# Patient Record
Sex: Male | Born: 1955 | Race: White | Hispanic: No | Marital: Married | Smoking: Never smoker
Health system: Southern US, Community
[De-identification: ages and names within clinical notes are randomized; demographics above are authoritative.]

## PROBLEM LIST (undated history)

## (undated) DIAGNOSIS — M1990 Unspecified osteoarthritis, unspecified site: Secondary | ICD-10-CM

## (undated) DIAGNOSIS — IMO0001 Reserved for inherently not codable concepts without codable children: Secondary | ICD-10-CM

## (undated) DIAGNOSIS — R030 Elevated blood-pressure reading, without diagnosis of hypertension: Secondary | ICD-10-CM

## (undated) DIAGNOSIS — N200 Calculus of kidney: Secondary | ICD-10-CM

## (undated) DIAGNOSIS — M5126 Other intervertebral disc displacement, lumbar region: Secondary | ICD-10-CM

## (undated) DIAGNOSIS — S68411A Complete traumatic amputation of right hand at wrist level, initial encounter: Secondary | ICD-10-CM

## (undated) HISTORY — PX: REATTACHMENT HAND: SHX5175

## (undated) HISTORY — DX: Calculus of kidney: N20.0

## (undated) HISTORY — PX: INGUINAL HERNIA REPAIR: SHX194

## (undated) HISTORY — DX: Elevated blood-pressure reading, without diagnosis of hypertension: R03.0

## (undated) HISTORY — PX: BACK SURGERY: SHX140

## (undated) HISTORY — PX: OTHER SURGICAL HISTORY: SHX169

## (undated) HISTORY — PX: LUMBAR LAMINECTOMY: SHX95

## (undated) HISTORY — DX: Complete traumatic amputation of right hand at wrist level, initial encounter: S68.411A

## (undated) HISTORY — DX: Other intervertebral disc displacement, lumbar region: M51.26

## (undated) HISTORY — PX: CERVICAL FUSION: SHX112

## (undated) HISTORY — PX: COLONOSCOPY: SHX174

## (undated) HISTORY — DX: Reserved for inherently not codable concepts without codable children: IMO0001

## (undated) HISTORY — DX: Unspecified osteoarthritis, unspecified site: M19.90

---

## 2015-02-22 ENCOUNTER — Encounter: Payer: Self-pay | Admitting: Physician Assistant

## 2015-02-22 ENCOUNTER — Ambulatory Visit (INDEPENDENT_AMBULATORY_CARE_PROVIDER_SITE_OTHER): Payer: Commercial Managed Care - HMO | Admitting: Physician Assistant

## 2015-02-22 VITALS — BP 128/94 | HR 90 | Temp 98.0°F | Resp 16 | Ht 71.0 in | Wt 226.4 lb

## 2015-02-22 DIAGNOSIS — M130 Polyarthritis, unspecified: Secondary | ICD-10-CM | POA: Insufficient documentation

## 2015-02-22 DIAGNOSIS — M1990 Unspecified osteoarthritis, unspecified site: Secondary | ICD-10-CM | POA: Diagnosis not present

## 2015-02-22 DIAGNOSIS — M5090 Cervical disc disorder, unspecified, unspecified cervical region: Secondary | ICD-10-CM | POA: Insufficient documentation

## 2015-02-22 DIAGNOSIS — M5416 Radiculopathy, lumbar region: Secondary | ICD-10-CM | POA: Insufficient documentation

## 2015-02-22 MED ORDER — GABAPENTIN 100 MG PO CAPS: 100.0000 mg | ORAL_CAPSULE | Freq: Three times a day (TID) | ORAL | Status: DC

## 2015-02-22 NOTE — Assessment & Plan Note (Signed)
Referral to Neurosurgery placed. Will obtain old imaging records.

## 2015-02-22 NOTE — Patient Instructions (Signed)
Please take the Gabapentin as follows:   -- Take 1 tablet at bedtime x 2 days.   -- Then take 1 tablet each morning and at bedtime x 2 days.   -- Then increase to 1 tablet 3 x day.  You will be contacted by Neurosurgery for evaluation. Continue Aleve as needed. The swimming is good for your joints.  Follow-up 1 month. We will do your physical at that time.  Welcome to [facility]!

## 2015-02-22 NOTE — Progress Notes (Signed)
Patient presents to clinic today to establish care.  Patient with significant history of chronic cervical and lumbar spinal issues. Patient with chronic lumbar radiculopathy status post laminectomy in 2008. Endorses significant pain daily. Is only taking Aleve as needed with little relief of symptoms patient also with history of cervical disc disease and radiculopathy. Is not currently followed by neurosurgery, as his previous specialist passed away. Would like referral to new specialist. Would like to discuss non-narcotic options for pain management.  Past Medical History  Diagnosis Date  . 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 Herniation of lumbar intervertebral disc     and Cervical Neck  . Arthritis     Back  . Kidney stone     Last 25 years ago  . Elevated blood pressure     Past Surgical History  Procedure Laterality Date  . Cervical fusion      2009  . Reattachment hand      1981  . Inguinal hernia repair      x4  . Lumbar laminectomy      2011  . Medial facetectomy      No current outpatient prescriptions on file prior to visit.   No current facility-administered medications on file prior to visit.    No Known Allergies  Family History  Problem Relation Age of Onset  . Arthritis Mother     Living  . Prostate cancer Father 48    Deceased  . Stroke Father   . Bladder Cancer Brother   . Alzheimer's disease Maternal Grandfather   . Heart disease Other     Maternal Aunts & Uncles  . Cancer Other     Paternal Aunts & Uncles  . Heart disease Sister     x1  . Healthy Brother     x3  . Psychiatric Illness Brother     x1    Social History   Social History  . Marital Status: Married    Spouse Name: N/A  . Number of Children: 0  . Years of Education: N/A   Occupational History  . Not on file.   Social History Main Topics  . Smoking status: Never Smoker   . Smokeless tobacco: Never Used  . Alcohol Use: No  . Drug Use: No  .  Sexual Activity: Not on file   Other Topics Concern  . Not on file   Social History Narrative  . No narrative on file   Review of Systems  Constitutional: Negative for fever and weight loss.  Musculoskeletal: Positive for back pain, joint pain and neck pain. Negative for myalgias and falls.  Neurological: Negative for dizziness and loss of consciousness.  Psychiatric/Behavioral: Negative for depression. The patient is not nervous/anxious and does not have insomnia.    BP 128/94 mmHg  Pulse 90  Temp(Src) 98 F (36.7 C) (Oral)  Resp 16  Ht 5\' 11"  (1.803 m)  Wt 226 lb 6 oz (102.683 kg)  BMI 31.59 kg/m2  SpO2 98%  Physical Exam  Constitutional: He is oriented to person, place, and time and well-developed, well-nourished, and in no distress.  HENT:  Head: Normocephalic and atraumatic.  Cardiovascular: Normal rate, regular rhythm, normal heart sounds and intact distal pulses.   Pulmonary/Chest: Effort normal and breath sounds normal. No respiratory distress. He has no wheezes. He has no rales. He exhibits no tenderness.  Musculoskeletal:       Cervical back: He exhibits pain.  He exhibits no tenderness and no bony tenderness.       Thoracic back: He exhibits pain. He exhibits no tenderness and no bony tenderness.       Lumbar back: He exhibits pain. He exhibits no tenderness and no bony tenderness.  Neurological: He is alert and oriented to person, place, and time.  Skin: Skin is warm and dry. No rash noted.  Psychiatric: Affect normal.  Vitals reviewed.    Assessment/Plan: Chronic lumbar radiculopathy S/p laminectomy in 2008. With chronic pain and still with occasional radicular symptoms. Will refer to Neurosurgery. Discussed pain management options but patient does not with to take any controlled substances. Will begin Gabapentin 100 mg QD x 2 days, BID x 2 days then TID. Will likely titrate to 300 mg TID at next visit to reach therapeutic response. Continue low-impact exercise,  especially swimming.  Cervical disc disease Referral to Neurosurgery placed. Will obtain old imaging records.

## 2015-02-22 NOTE — Progress Notes (Signed)
Pre visit review using our clinic review tool, if applicable. No additional management support is needed unless otherwise documented below in the visit note/SLS  

## 2015-02-22 NOTE — Assessment & Plan Note (Signed)
S/p laminectomy in 2008. With chronic pain and still with occasional radicular symptoms. Will refer to Neurosurgery. Discussed pain management options but patient does not with to take any controlled substances. Will begin Gabapentin 100 mg QD x 2 days, BID x 2 days then TID. Will likely titrate to 300 mg TID at next visit to reach therapeutic response. Continue low-impact exercise, especially swimming.

## 2015-03-06 ENCOUNTER — Telehealth: Payer: Self-pay | Admitting: Physician Assistant

## 2015-03-06 MED ORDER — GABAPENTIN 100 MG PO CAPS: 100.0000 mg | ORAL_CAPSULE | Freq: Three times a day (TID) | ORAL | Status: DC

## 2015-03-06 NOTE — Telephone Encounter (Signed)
Patient is tolerating medication, informed of change in dosage, understood & agreed & is set up to see Neurosurgery; change made is SIG for Gabapentin & pt will let us know when new Rx order is needed/SLS

## 2015-03-06 NOTE — Telephone Encounter (Signed)
Pt returning your call. Best # [phone].

## 2015-03-06 NOTE — Telephone Encounter (Signed)
LMOM with contact name and number for return call [id number] per provider instructions/SLS

## 2015-03-06 NOTE — Telephone Encounter (Signed)
-----   Message from Waldon Merl, PA-C sent at 02/22/2015 12:47 PM EDT ----- Regarding: Gabapentin Call patient to see how he is tolerating Gabapentin -- If so increase bedtime dose to 300 mg.

## 2015-03-13 ENCOUNTER — Other Ambulatory Visit: Payer: Self-pay | Admitting: Neurological Surgery

## 2015-03-22 ENCOUNTER — Ambulatory Visit: Payer: Commercial Managed Care - HMO | Admitting: Physician Assistant

## 2015-03-22 ENCOUNTER — Encounter: Payer: Commercial Managed Care - HMO | Admitting: Physician Assistant

## 2015-03-23 ENCOUNTER — Other Ambulatory Visit (HOSPITAL_COMMUNITY): Payer: Self-pay | Admitting: *Deleted

## 2015-03-23 NOTE — Pre-Procedure Instructions (Signed)
Gregory Foley  03/23/2015     Your procedure is scheduled on Monday, April 03, 2015 at 11:40 AM.   Report to [facility] Entrance "A" Admitting Office at 8:45 AM.   Call this number if you have problems the morning of surgery: [phone]   Any questions prior to day of surgery, please call [phone] between 8 & 4 PM.   Remember:  Do not eat food or drink liquids after midnight Sunday, 04/02/15.  Take these medicines the morning of surgery with A SIP OF WATER: Gabapentin (Neurontin)  Stop NSAIDS (Aleve, Ibuprofen, etc.) 7 days prior to surgery. Do not use any Aspirin products 7 days prior to surgery.   Do not wear jewelry.  Do not wear lotions, powders, or cologne.  You may wear deodorant.  Men may shave face and neck.  Do not bring valuables to the hospital.  [facility] is not responsible for any belongings or valuables.  Contacts, dentures or bridgework may not be worn into surgery.  Leave your suitcase in the car.  After surgery it may be brought to your room.  For patients admitted to the hospital, discharge time will be determined by your treatment team.  Special instructions: [place] - Preparing for Surgery  Before surgery, you can play an important role.  Because skin is not sterile, your skin needs to be as free of germs as possible.  You can reduce the number of germs on you skin by washing with CHG (chlorahexidine gluconate) soap before surgery.  CHG is an antiseptic cleaner which kills germs and bonds with the skin to continue killing germs even after washing.  Please DO NOT use if you have an allergy to CHG or antibacterial soaps.  If your skin becomes reddened/irritated stop using the CHG and inform your nurse when you arrive at Short Stay.  Do not shave (including legs and underarms) for at least 48 hours prior to the first CHG shower.  You may shave your face.  Please follow these instructions carefully:   1.  Shower with CHG Soap the night  before surgery and the                                morning of Surgery.  2.  If you choose to wash your hair, wash your hair first as usual with your       normal shampoo.  3.  After you shampoo, rinse your hair and body thoroughly to remove the                      Shampoo.  4.  Use CHG as you would any other liquid soap.  You can apply chg directly       to the skin and wash gently with scrungie or a clean washcloth.  5.  Apply the CHG Soap to your body ONLY FROM THE NECK DOWN.        Do not use on open wounds or open sores.  Avoid contact with your eyes, ears, mouth and genitals (private parts).  Wash genitals (private parts) with your normal soap.  6.  Wash thoroughly, paying special attention to the area where your surgery        will be performed.  7.  Thoroughly rinse your body with warm water from the neck down.  8.  DO NOT shower/wash with your normal soap after using  and rinsing off       the CHG Soap.  9.  Pat yourself dry with a clean towel.            10.  Wear clean pajamas.            11.  Place clean sheets on your bed the night of your first shower and do not        sleep with pets.  Day of Surgery  Do not apply any lotions the morning of surgery.  Please wear clean clothes to the hospital.   Please read over the following fact sheets that you were given. Pain Booklet, Coughing and Deep Breathing, Blood Transfusion Information, MRSA Information and Surgical Site Infection Prevention

## 2015-03-24 ENCOUNTER — Encounter (HOSPITAL_COMMUNITY): Payer: Self-pay

## 2015-03-24 ENCOUNTER — Encounter (HOSPITAL_COMMUNITY)
Admission: RE | Admit: 2015-03-24 | Discharge: 2015-03-24 | Disposition: A | Discharge status: Home / Self Care | Payer: Commercial Managed Care - HMO | Source: Ambulatory Visit | Attending: Neurological Surgery | Admitting: Neurological Surgery

## 2015-03-24 DIAGNOSIS — Z01818 Encounter for other preprocedural examination: Secondary | ICD-10-CM | POA: Insufficient documentation

## 2015-03-24 LAB — CBC
HEMATOCRIT: 47 % (ref 39.0–52.0)
Hemoglobin: 16 g/dL (ref 13.0–17.0)
MCH: 29.4 pg (ref 26.0–34.0)
MCHC: 34 g/dL (ref 30.0–36.0)
MCV: 86.4 fL (ref 78.0–100.0)
PLATELETS: 142 10*3/uL — AB (ref 150–400)
RBC: 5.44 MIL/uL (ref 4.22–5.81)
RDW: 13.2 % (ref 11.5–15.5)
WBC: 4.9 10*3/uL (ref 4.0–10.5)

## 2015-03-24 LAB — SURGICAL PCR SCREEN
MRSA, PCR: NEGATIVE
STAPHYLOCOCCUS AUREUS: POSITIVE — AB

## 2015-03-24 LAB — TYPE AND SCREEN
ABO/RH(D): A NEG
ANTIBODY SCREEN: NEGATIVE

## 2015-03-24 LAB — ABO/RH: ABO/RH(D): A NEG

## 2015-03-24 NOTE — Progress Notes (Addendum)
Anesthesia Chart Review:  Pt is 59 year old male scheduled for L3-4, L4-5, L5-S1 PLIF on 04/03/2015 with Dr. Danielle Dess.   PMH includes: elevated blood pressure without diagnosis of HTN. Never smoker. BMI 32.   Preoperative labs reviewed.    EKG 03/24/2015: NSR. Low voltage QRS. Septal infarct, age undetermined.   Vital signs were not recorded at PAT. If they are acceptable DOS, I anticipate pt can proceed as scheduled.   Rica Mast, FNP-BC [facility]/Anesthesiology Phone: [phone] 03/27/2015 4:47 PM

## 2015-03-24 NOTE — Progress Notes (Signed)
Mupirocin Ointment Rx called into [facility] on [address] in [facility] for positive PCR of Staph. Left message on pt's voicemail informing him of results.

## 2015-03-24 NOTE — Progress Notes (Signed)
Pt denies cardiac history, chest pain or sob. He states that he was sent to an urgent care from his dentist to have his BP checked several years ago. He states the MD at the urgent care put him on a BP medication and he took it for a short time but his PCP didn't feel that he needed to continue it. He states his BP can be high at times, especially if he is in pain or at a physician's office.

## 2015-04-03 ENCOUNTER — Encounter (HOSPITAL_COMMUNITY)
Admission: AD | Disposition: A | Discharge status: Home / Self Care | Payer: Medicare Other | Source: Ambulatory Visit | Attending: Neurological Surgery

## 2015-04-03 ENCOUNTER — Encounter (HOSPITAL_COMMUNITY): Payer: Self-pay | Admitting: Certified Registered Nurse Anesthetist

## 2015-04-03 ENCOUNTER — Inpatient Hospital Stay (HOSPITAL_COMMUNITY): Payer: Commercial Managed Care - HMO | Admitting: Emergency Medicine

## 2015-04-03 ENCOUNTER — Inpatient Hospital Stay (HOSPITAL_COMMUNITY): Payer: Commercial Managed Care - HMO

## 2015-04-03 ENCOUNTER — Inpatient Hospital Stay (HOSPITAL_COMMUNITY)
Admission: AD | Admit: 2015-04-03 | Discharge: 2015-04-06 | DRG: 460 | Disposition: A | Discharge status: Home / Self Care | Payer: Commercial Managed Care - HMO | Source: Ambulatory Visit | Attending: Neurological Surgery | Admitting: Neurological Surgery

## 2015-04-03 ENCOUNTER — Inpatient Hospital Stay (HOSPITAL_COMMUNITY): Payer: Commercial Managed Care - HMO | Admitting: Certified Registered Nurse Anesthetist

## 2015-04-03 DIAGNOSIS — M4726 Other spondylosis with radiculopathy, lumbar region: Secondary | ICD-10-CM | POA: Diagnosis present

## 2015-04-03 DIAGNOSIS — D62 Acute posthemorrhagic anemia: Secondary | ICD-10-CM | POA: Diagnosis not present

## 2015-04-03 DIAGNOSIS — R319 Hematuria, unspecified: Secondary | ICD-10-CM | POA: Diagnosis not present

## 2015-04-03 DIAGNOSIS — N179 Acute kidney failure, unspecified: Secondary | ICD-10-CM | POA: Diagnosis not present

## 2015-04-03 DIAGNOSIS — Z419 Encounter for procedure for purposes other than remedying health state, unspecified: Secondary | ICD-10-CM

## 2015-04-03 DIAGNOSIS — M960 Pseudarthrosis after fusion or arthrodesis: Secondary | ICD-10-CM | POA: Diagnosis present

## 2015-04-03 DIAGNOSIS — D696 Thrombocytopenia, unspecified: Secondary | ICD-10-CM | POA: Diagnosis not present

## 2015-04-03 DIAGNOSIS — M48062 Spinal stenosis, lumbar region with neurogenic claudication: Secondary | ICD-10-CM | POA: Diagnosis present

## 2015-04-03 DIAGNOSIS — M4806 Spinal stenosis, lumbar region: Secondary | ICD-10-CM | POA: Diagnosis not present

## 2015-04-03 DIAGNOSIS — M545 Low back pain: Secondary | ICD-10-CM | POA: Diagnosis present

## 2015-04-03 DIAGNOSIS — M7130 Other bursal cyst, unspecified site: Secondary | ICD-10-CM | POA: Diagnosis present

## 2015-04-03 DIAGNOSIS — Z79899 Other long term (current) drug therapy: Secondary | ICD-10-CM | POA: Diagnosis not present

## 2015-04-03 LAB — POCT I-STAT 4, (NA,K, GLUC, HGB,HCT)
GLUCOSE: 113 mg/dL — AB (ref 65–99)
Glucose, Bld: 138 mg/dL — ABNORMAL HIGH (ref 65–99)
HCT: 34 % — ABNORMAL LOW (ref 39.0–52.0)
HEMATOCRIT: 42 % (ref 39.0–52.0)
HEMOGLOBIN: 14.3 g/dL (ref 13.0–17.0)
Hemoglobin: 11.6 g/dL — ABNORMAL LOW (ref 13.0–17.0)
Potassium: 4.4 mmol/L (ref 3.5–5.1)
Potassium: 4.3 mmol/L (ref 3.5–5.1)
SODIUM: 141 mmol/L (ref 135–145)
Sodium: 139 mmol/L (ref 135–145)

## 2015-04-03 SURGERY — POSTERIOR LUMBAR FUSION 3 LEVEL
Anesthesia: General | Site: Back

## 2015-04-03 MED ORDER — SODIUM CHLORIDE 0.9 % IR SOLN
Status: DC | PRN
  Administered 2015-04-03: 11:00:00

## 2015-04-03 MED ORDER — HYDROMORPHONE HCL 1 MG/ML IJ SOLN
0.2500 mg | INTRAMUSCULAR | Status: DC | PRN
  Administered 2015-04-03: 0.5 mg via INTRAVENOUS

## 2015-04-03 MED ORDER — SODIUM CHLORIDE 0.9 % IV SOLN
INTRAVENOUS | Status: DC | PRN
  Administered 2015-04-03 (×2): via INTRAVENOUS

## 2015-04-03 MED ORDER — ROCURONIUM BROMIDE 100 MG/10ML IV SOLN
INTRAVENOUS | Status: DC | PRN
  Administered 2015-04-03: 50 mg via INTRAVENOUS

## 2015-04-03 MED ORDER — SODIUM CHLORIDE 0.9 % IJ SOLN
INTRAMUSCULAR | Status: AC
  Filled 2015-04-03: qty 10

## 2015-04-03 MED ORDER — CEFAZOLIN SODIUM 1-5 GM-% IV SOLN
1.0000 g | Freq: Three times a day (TID) | INTRAVENOUS | Status: AC
  Administered 2015-04-03 – 2015-04-04 (×2): 1 g via INTRAVENOUS
  Filled 2015-04-03 (×2): qty 50

## 2015-04-03 MED ORDER — CEFAZOLIN SODIUM-DEXTROSE 2-3 GM-% IV SOLR
INTRAVENOUS | Status: AC
  Filled 2015-04-03: qty 50

## 2015-04-03 MED ORDER — SENNA 8.6 MG PO TABS
1.0000 | ORAL_TABLET | Freq: Two times a day (BID) | ORAL | Status: DC
  Administered 2015-04-03 – 2015-04-06 (×6): 8.6 mg via ORAL
  Filled 2015-04-03 (×6): qty 1

## 2015-04-03 MED ORDER — CEFAZOLIN SODIUM-DEXTROSE 2-3 GM-% IV SOLR
INTRAVENOUS | Status: DC | PRN
  Administered 2015-04-03 (×2): 2 g via INTRAVENOUS

## 2015-04-03 MED ORDER — FENTANYL CITRATE (PF) 250 MCG/5ML IJ SOLN
INTRAMUSCULAR | Status: AC
  Filled 2015-04-03: qty 5

## 2015-04-03 MED ORDER — ACETAMINOPHEN 325 MG PO TABS: 650.0000 mg | ORAL_TABLET | ORAL | Status: DC | PRN

## 2015-04-03 MED ORDER — MEPERIDINE HCL 25 MG/ML IJ SOLN: 6.2500 mg | INTRAMUSCULAR | Status: DC | PRN

## 2015-04-03 MED ORDER — GABAPENTIN 100 MG PO CAPS: 100.0000 mg | ORAL_CAPSULE | Freq: Three times a day (TID) | ORAL | Status: DC

## 2015-04-03 MED ORDER — SODIUM CHLORIDE 0.9 % IJ SOLN
3.0000 mL | Freq: Two times a day (BID) | INTRAMUSCULAR | Status: DC
  Administered 2015-04-03 – 2015-04-05 (×4): 3 mL via INTRAVENOUS

## 2015-04-03 MED ORDER — GABAPENTIN 100 MG PO CAPS
100.0000 mg | ORAL_CAPSULE | ORAL | Status: DC
Start: 2015-04-04 — End: 2015-04-05
  Administered 2015-04-04 – 2015-04-05 (×3): 100 mg via ORAL
  Filled 2015-04-03 (×3): qty 1

## 2015-04-03 MED ORDER — FENTANYL CITRATE (PF) 100 MCG/2ML IJ SOLN
INTRAMUSCULAR | Status: DC | PRN
  Administered 2015-04-03 (×2): 50 ug via INTRAVENOUS
  Administered 2015-04-03: 100 ug via INTRAVENOUS
  Administered 2015-04-03: 50 ug via INTRAVENOUS

## 2015-04-03 MED ORDER — LIDOCAINE HCL (CARDIAC) 20 MG/ML IV SOLN
INTRAVENOUS | Status: DC | PRN
  Administered 2015-04-03: 80 mg via INTRAVENOUS

## 2015-04-03 MED ORDER — ONDANSETRON HCL 4 MG/2ML IJ SOLN
4.0000 mg | INTRAMUSCULAR | Status: DC | PRN
  Administered 2015-04-03 – 2015-04-04 (×3): 4 mg via INTRAVENOUS
  Filled 2015-04-03 (×3): qty 2

## 2015-04-03 MED ORDER — DEXAMETHASONE SODIUM PHOSPHATE 10 MG/ML IJ SOLN
INTRAMUSCULAR | Status: DC | PRN
  Administered 2015-04-03: 10 mg via INTRAVENOUS

## 2015-04-03 MED ORDER — OXYCODONE-ACETAMINOPHEN 5-325 MG PO TABS
1.0000 | ORAL_TABLET | ORAL | Status: DC | PRN
  Administered 2015-04-05 – 2015-04-06 (×5): 2 via ORAL
  Filled 2015-04-03 (×5): qty 2

## 2015-04-03 MED ORDER — EPHEDRINE SULFATE 50 MG/ML IJ SOLN
INTRAMUSCULAR | Status: AC
  Filled 2015-04-03: qty 1

## 2015-04-03 MED ORDER — MENTHOL 3 MG MT LOZG: 1.0000 | LOZENGE | OROMUCOSAL | Status: DC | PRN

## 2015-04-03 MED ORDER — DOCUSATE SODIUM 100 MG PO CAPS
100.0000 mg | ORAL_CAPSULE | Freq: Two times a day (BID) | ORAL | Status: DC
  Administered 2015-04-03 – 2015-04-06 (×6): 100 mg via ORAL
  Filled 2015-04-03 (×6): qty 1

## 2015-04-03 MED ORDER — ARTIFICIAL TEARS OP OINT
TOPICAL_OINTMENT | OPHTHALMIC | Status: AC
  Filled 2015-04-03: qty 3.5

## 2015-04-03 MED ORDER — LIDOCAINE HCL (CARDIAC) 20 MG/ML IV SOLN
INTRAVENOUS | Status: AC
  Filled 2015-04-03: qty 5

## 2015-04-03 MED ORDER — LIDOCAINE-EPINEPHRINE 1 %-1:100000 IJ SOLN
INTRAMUSCULAR | Status: DC | PRN
  Administered 2015-04-03: 10 mL

## 2015-04-03 MED ORDER — VECURONIUM BROMIDE 10 MG IV SOLR
INTRAVENOUS | Status: DC | PRN
  Administered 2015-04-03: 2 mg via INTRAVENOUS
  Administered 2015-04-03: 3 mg via INTRAVENOUS
  Administered 2015-04-03 (×4): 2 mg via INTRAVENOUS
  Administered 2015-04-03: 3 mg via INTRAVENOUS
  Administered 2015-04-03 (×2): 2 mg via INTRAVENOUS

## 2015-04-03 MED ORDER — MIDAZOLAM HCL 5 MG/5ML IJ SOLN
INTRAMUSCULAR | Status: DC | PRN
  Administered 2015-04-03: 2 mg via INTRAVENOUS

## 2015-04-03 MED ORDER — BUPIVACAINE HCL (PF) 0.5 % IJ SOLN
INTRAMUSCULAR | Status: DC | PRN
  Administered 2015-04-03: 10 mL
  Administered 2015-04-03: 25 mL

## 2015-04-03 MED ORDER — KETOROLAC TROMETHAMINE 15 MG/ML IJ SOLN
15.0000 mg | Freq: Four times a day (QID) | INTRAMUSCULAR | Status: DC
  Administered 2015-04-04 (×2): 15 mg via INTRAVENOUS
  Filled 2015-04-03 (×2): qty 1

## 2015-04-03 MED ORDER — POLYETHYLENE GLYCOL 3350 17 G PO PACK: 17.0000 g | PACK | Freq: Every day | ORAL | Status: DC | PRN

## 2015-04-03 MED ORDER — SODIUM CHLORIDE 0.9 % IJ SOLN
INTRAMUSCULAR | Status: AC
  Filled 2015-04-03: qty 20

## 2015-04-03 MED ORDER — GABAPENTIN 300 MG PO CAPS
300.0000 mg | ORAL_CAPSULE | Freq: Every day | ORAL | Status: DC
  Administered 2015-04-03 – 2015-04-04 (×2): 300 mg via ORAL
  Filled 2015-04-03 (×2): qty 1

## 2015-04-03 MED ORDER — ACETAMINOPHEN 650 MG RE SUPP: 650.0000 mg | RECTAL | Status: DC | PRN

## 2015-04-03 MED ORDER — METHOCARBAMOL 1000 MG/10ML IJ SOLN: 500.0000 mg | INTRAVENOUS | Status: DC

## 2015-04-03 MED ORDER — SUGAMMADEX SODIUM 500 MG/5ML IV SOLN
INTRAVENOUS | Status: AC
  Filled 2015-04-03: qty 5

## 2015-04-03 MED ORDER — ROCURONIUM BROMIDE 50 MG/5ML IV SOLN
INTRAVENOUS | Status: AC
  Filled 2015-04-03: qty 2

## 2015-04-03 MED ORDER — ONDANSETRON HCL 4 MG/2ML IJ SOLN
INTRAMUSCULAR | Status: DC | PRN
  Administered 2015-04-03: 4 mg via INTRAVENOUS

## 2015-04-03 MED ORDER — PROPOFOL 10 MG/ML IV BOLUS
INTRAVENOUS | Status: DC | PRN
  Administered 2015-04-03: 200 mg via INTRAVENOUS

## 2015-04-03 MED ORDER — PROPOFOL 10 MG/ML IV BOLUS
INTRAVENOUS | Status: AC
  Filled 2015-04-03: qty 20

## 2015-04-03 MED ORDER — SODIUM CHLORIDE 0.9 % IV SOLN
INTRAVENOUS | Status: DC
  Administered 2015-04-03: 100 mL/h via INTRAVENOUS

## 2015-04-03 MED ORDER — METHOCARBAMOL 1000 MG/10ML IJ SOLN
500.0000 mg | Freq: Four times a day (QID) | INTRAVENOUS | Status: DC | PRN
  Administered 2015-04-03: 500 mg via INTRAVENOUS
  Filled 2015-04-03 (×2): qty 5

## 2015-04-03 MED ORDER — LACTATED RINGERS IV SOLN
INTRAVENOUS | Status: DC | PRN
  Administered 2015-04-03 (×4): via INTRAVENOUS

## 2015-04-03 MED ORDER — BISACODYL 10 MG RE SUPP: 10.0000 mg | Freq: Every day | RECTAL | Status: DC | PRN

## 2015-04-03 MED ORDER — MIDAZOLAM HCL 2 MG/2ML IJ SOLN
INTRAMUSCULAR | Status: AC
  Filled 2015-04-03: qty 4

## 2015-04-03 MED ORDER — ALBUMIN HUMAN 5 % IV SOLN
INTRAVENOUS | Status: DC | PRN
  Administered 2015-04-03 (×2): via INTRAVENOUS

## 2015-04-03 MED ORDER — HYDROCODONE-ACETAMINOPHEN 5-325 MG PO TABS
1.0000 | ORAL_TABLET | ORAL | Status: DC | PRN
  Administered 2015-04-04: 2 via ORAL
  Filled 2015-04-03: qty 2

## 2015-04-03 MED ORDER — MAGNESIUM CITRATE PO SOLN: 1.0000 | Freq: Once | ORAL | Status: DC | PRN

## 2015-04-03 MED ORDER — HYDROMORPHONE HCL 1 MG/ML IJ SOLN
INTRAMUSCULAR | Status: AC
  Filled 2015-04-03: qty 1

## 2015-04-03 MED ORDER — SODIUM CHLORIDE 0.9 % IJ SOLN: 3.0000 mL | INTRAMUSCULAR | Status: DC | PRN

## 2015-04-03 MED ORDER — HYDROMORPHONE HCL 1 MG/ML IJ SOLN
0.5000 mg | INTRAMUSCULAR | Status: DC | PRN
  Administered 2015-04-04: 1 mg via INTRAVENOUS
  Filled 2015-04-03 (×2): qty 1

## 2015-04-03 MED ORDER — ONDANSETRON HCL 4 MG/2ML IJ SOLN
INTRAMUSCULAR | Status: AC
  Filled 2015-04-03: qty 2

## 2015-04-03 MED ORDER — PHENOL 1.4 % MT LIQD
1.0000 | OROMUCOSAL | Status: DC | PRN
Start: 2015-04-03 — End: 2015-04-06

## 2015-04-03 MED ORDER — 0.9 % SODIUM CHLORIDE (POUR BTL) OPTIME
TOPICAL | Status: DC | PRN
  Administered 2015-04-03: 1000 mL

## 2015-04-03 MED ORDER — CEFAZOLIN SODIUM-DEXTROSE 2-3 GM-% IV SOLR: 2.0000 g | INTRAVENOUS | Status: DC

## 2015-04-03 MED ORDER — METHOCARBAMOL 500 MG PO TABS
500.0000 mg | ORAL_TABLET | Freq: Four times a day (QID) | ORAL | Status: DC | PRN
Start: 2015-04-03 — End: 2015-04-06
  Administered 2015-04-05: 500 mg via ORAL
  Filled 2015-04-03 (×2): qty 1

## 2015-04-03 MED ORDER — VECURONIUM BROMIDE 10 MG IV SOLR
INTRAVENOUS | Status: AC
  Filled 2015-04-03: qty 10

## 2015-04-03 MED ORDER — ALUM & MAG HYDROXIDE-SIMETH 200-200-20 MG/5ML PO SUSP: 30.0000 mL | Freq: Four times a day (QID) | ORAL | Status: DC | PRN

## 2015-04-03 MED ORDER — ROCURONIUM BROMIDE 50 MG/5ML IV SOLN
INTRAVENOUS | Status: AC
  Filled 2015-04-03: qty 1

## 2015-04-03 MED ORDER — LACTATED RINGERS IV SOLN: INTRAVENOUS | Status: DC

## 2015-04-03 MED ORDER — PROMETHAZINE HCL 25 MG/ML IJ SOLN: 6.2500 mg | INTRAMUSCULAR | Status: DC | PRN

## 2015-04-03 MED ORDER — THROMBIN 20000 UNITS EX SOLR
CUTANEOUS | Status: DC | PRN
  Administered 2015-04-03 (×2): via TOPICAL

## 2015-04-03 MED ORDER — THROMBIN 5000 UNITS EX SOLR
OROMUCOSAL | Status: DC | PRN
  Administered 2015-04-03: 11:00:00 via TOPICAL

## 2015-04-03 MED ORDER — PHENYLEPHRINE HCL 10 MG/ML IJ SOLN
10.0000 mg | INTRAVENOUS | Status: DC | PRN
  Administered 2015-04-03: 10 ug/min via INTRAVENOUS

## 2015-04-03 MED ORDER — SODIUM CHLORIDE 0.9 % IV SOLN: 250.0000 mL | INTRAVENOUS | Status: DC

## 2015-04-03 MED ORDER — SUGAMMADEX SODIUM 500 MG/5ML IV SOLN
INTRAVENOUS | Status: DC | PRN
  Administered 2015-04-03: 200 mg via INTRAVENOUS

## 2015-04-03 MED ORDER — LACTATED RINGERS IV SOLN
INTRAVENOUS | Status: DC
  Administered 2015-04-03: 10:00:00 via INTRAVENOUS

## 2015-04-03 MED ORDER — EPHEDRINE SULFATE 50 MG/ML IJ SOLN
INTRAMUSCULAR | Status: DC | PRN
  Administered 2015-04-03 (×2): 5 mg via INTRAVENOUS

## 2015-04-03 SURGICAL SUPPLY — 72 items
BAG DECANTER FOR FLEXI CONT (MISCELLANEOUS) ×3 IMPLANT
BLADE CLIPPER SURG (BLADE) IMPLANT
BUR MATCHSTICK NEURO 3.0 LAGG (BURR) ×3 IMPLANT
CAGE COROENT LRG MP 8X9X28-12 (Cage) ×12 IMPLANT
CAGE PLIF MAS 9X8X28-4 LUMBAR (Cage) ×6 IMPLANT
CANISTER SUCT 3000ML PPV (MISCELLANEOUS) ×3 IMPLANT
CONT SPEC 4OZ CLIKSEAL STRL BL (MISCELLANEOUS) ×3 IMPLANT
COVER BACK TABLE 60X90IN (DRAPES) ×3 IMPLANT
DECANTER SPIKE VIAL GLASS SM (MISCELLANEOUS) ×3 IMPLANT
DERMABOND ADVANCED (GAUZE/BANDAGES/DRESSINGS) ×2
DERMABOND ADVANCED .7 DNX12 (GAUZE/BANDAGES/DRESSINGS) ×1 IMPLANT
DRAPE C-ARM 42X72 X-RAY (DRAPES) ×6 IMPLANT
DRAPE LAPAROTOMY 100X72X124 (DRAPES) ×3 IMPLANT
DRAPE POUCH INSTRU U-SHP 10X18 (DRAPES) ×3 IMPLANT
DRAPE PROXIMA HALF (DRAPES) IMPLANT
DRSG OPSITE 4X5.5 SM (GAUZE/BANDAGES/DRESSINGS) ×3 IMPLANT
DRSG OPSITE POSTOP 4X8 (GAUZE/BANDAGES/DRESSINGS) ×3 IMPLANT
DURAPREP 26ML APPLICATOR (WOUND CARE) ×3 IMPLANT
ELECT BLADE 4.0 EZ CLEAN MEGAD (MISCELLANEOUS) ×3
ELECT REM PT RETURN 9FT ADLT (ELECTROSURGICAL) ×3
ELECTRODE BLDE 4.0 EZ CLN MEGD (MISCELLANEOUS) ×1 IMPLANT
ELECTRODE REM PT RTRN 9FT ADLT (ELECTROSURGICAL) ×1 IMPLANT
EVACUATOR 3/16  PVC DRAIN (DRAIN) ×2
EVACUATOR 3/16 PVC DRAIN (DRAIN) ×1 IMPLANT
GAUZE SPONGE 4X4 12PLY STRL (GAUZE/BANDAGES/DRESSINGS) ×3 IMPLANT
GAUZE SPONGE 4X4 16PLY XRAY LF (GAUZE/BANDAGES/DRESSINGS) ×3 IMPLANT
GLOVE BIO SURGEON STRL SZ 6.5 (GLOVE) ×4 IMPLANT
GLOVE BIO SURGEON STRL SZ8 (GLOVE) ×3 IMPLANT
GLOVE BIO SURGEON STRL SZ8.5 (GLOVE) ×3 IMPLANT
GLOVE BIO SURGEONS STRL SZ 6.5 (GLOVE) ×2
GLOVE BIOGEL PI IND STRL 8.5 (GLOVE) ×2 IMPLANT
GLOVE BIOGEL PI INDICATOR 8.5 (GLOVE) ×4
GLOVE ECLIPSE 8.5 STRL (GLOVE) ×6 IMPLANT
GLOVE EXAM NITRILE LRG STRL (GLOVE) IMPLANT
GLOVE EXAM NITRILE MD LF STRL (GLOVE) IMPLANT
GLOVE EXAM NITRILE XL STR (GLOVE) IMPLANT
GLOVE EXAM NITRILE XS STR PU (GLOVE) IMPLANT
GLOVE INDICATOR 6.5 STRL GRN (GLOVE) ×3 IMPLANT
GOWN STRL REUS W/ TWL LRG LVL3 (GOWN DISPOSABLE) ×2 IMPLANT
GOWN STRL REUS W/ TWL XL LVL3 (GOWN DISPOSABLE) ×1 IMPLANT
GOWN STRL REUS W/TWL 2XL LVL3 (GOWN DISPOSABLE) ×6 IMPLANT
GOWN STRL REUS W/TWL LRG LVL3 (GOWN DISPOSABLE) ×4
GOWN STRL REUS W/TWL XL LVL3 (GOWN DISPOSABLE) ×2
HEMOSTAT POWDER KIT SURGIFOAM (HEMOSTASIS) ×3 IMPLANT
KIT BASIN OR (CUSTOM PROCEDURE TRAY) ×3 IMPLANT
KIT ROOM TURNOVER OR (KITS) ×3 IMPLANT
NEEDLE HYPO 22GX1.5 SAFETY (NEEDLE) ×3 IMPLANT
NS IRRIG 1000ML POUR BTL (IV SOLUTION) ×3 IMPLANT
PACK LAMINECTOMY NEURO (CUSTOM PROCEDURE TRAY) ×3 IMPLANT
PAD ARMBOARD 7.5X6 YLW CONV (MISCELLANEOUS) ×9 IMPLANT
PATTIES SURGICAL .5 X.5 (GAUZE/BANDAGES/DRESSINGS) ×3 IMPLANT
PATTIES SURGICAL .5 X1 (DISPOSABLE) ×6 IMPLANT
PATTIES SURGICAL 1X1 (DISPOSABLE) ×3 IMPLANT
ROD RELINE-O 5.5X100 LORD (Rod) ×6 IMPLANT
SCREW LOCK RELINE 5.5 TULIP (Screw) ×24 IMPLANT
SCREW RELINE-O POLY 6.5X45 (Screw) ×18 IMPLANT
SCREW RELINE-O POLY 6.5X50MM (Screw) ×6 IMPLANT
SPONGE LAP 4X18 X RAY DECT (DISPOSABLE) ×6 IMPLANT
SPONGE SURGIFOAM ABS GEL 100 (HEMOSTASIS) ×6 IMPLANT
STRIP VITOSS 25X100X4MM (Neuro Prosthesis/Implant) ×3 IMPLANT
SUT PROLENE 6 0 BV (SUTURE) ×3 IMPLANT
SUT VIC AB 1 CT1 18XBRD ANBCTR (SUTURE) ×2 IMPLANT
SUT VIC AB 1 CT1 8-18 (SUTURE) ×4
SUT VIC AB 2-0 CP2 18 (SUTURE) ×6 IMPLANT
SUT VIC AB 3-0 SH 8-18 (SUTURE) ×9 IMPLANT
SYR 3ML LL SCALE MARK (SYRINGE) ×12 IMPLANT
TOWEL OR 17X24 6PK STRL BLUE (TOWEL DISPOSABLE) ×3 IMPLANT
TOWEL OR 17X26 10 PK STRL BLUE (TOWEL DISPOSABLE) ×3 IMPLANT
TRAP SPECIMEN MUCOUS 40CC (MISCELLANEOUS) ×3 IMPLANT
TRAY FOLEY CATH 16FRSI W/METER (SET/KITS/TRAYS/PACK) ×3 IMPLANT
TRAY FOLEY W/METER SILVER 14FR (SET/KITS/TRAYS/PACK) IMPLANT
WATER STERILE IRR 1000ML POUR (IV SOLUTION) ×3 IMPLANT

## 2015-04-03 NOTE — Op Note (Signed)
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 Laminectomy L3 L4 L5 with decompression of the L3 L4 L5 and S1 nerve roots bilaterally and laterally with more work than require for simple posterior interbody arthrodesis. Posterior lumbar interbody arthrodesis using peek spacers and autograft L3-4 L4-5 L5-S1 segmental fixation L3 to the sacrum with pedicle screws posterior lateral arthrodesis with autograft and allograft. Surgeon: Barnett Abu First assistant: Delma Officer M.D. Anesthesia: Gen. endotracheal Indications: [name] is a 59 year old individual is had significant back and bilateral lower extremity pain. He's had previous surgical procedures at L3-4 and L4-5 where he's had an attempt at an arthrodesis with spire plate technique. This was done by Dr. Candise Che in [place] a number of years ago. He's had worsening back pain and leg pain. He has evidence of significant spondylosis and stenosis at L3-4 4551 particularly and lateral recesses for the exiting nerve roots on the right side. He's been advised regarding the need for surgical decompression and removal of the spire plate with posterior lateral arthrodesis in addition to a posterior lumbar interbody arthrodesis from L3 to sacrum.  Procedure: The patient was brought to the operating room supine on a stretcher. After the smooth induction of general endotracheal anesthesia, he was carefully turned prone. The back was prepped with alcohol DuraPrep. An elliptical incision was made around this previous scar and this tissue  was excised. The dissection was then carried down to the lumbar dorsal fascia. Fascia was opened on either side of midline to expose the region of the spire plate. Spire plate was then loosened and after removing some bone that had grown up around the L4 spinous process it was removed. There is clear-cut evidence of a pseudoarthrosis at the L3-L4 level. Thin the dissection was carried inferiorly to expose the laminar arch of L3 L4 L5 out to and including the facets at 5145 and 34. Transverse process of 3 was decorticated and the transverse processes of L4 and L5 and sacral alar were also exposed and decorticated and packed off for later use in grafting. Then with the lamina isolated laminotomy was created on the right side and left side of L3 removing the inferior margin laminar arch out to and including the entirety of the facet at L3-L4. Spinous process was also removed largely in this procedure. Then by carefully dissecting along the laminar arch and the pars region of L4 entire laminar arch and spinous process of L4 was removed L5 was removed in a similar fashion. During this procedure was 1 dural incursion and region of scar tissue on the left side at L4 this was closed with a simple figure-of-eight suture of 59 proline. He was tested to Valsalva and was watertight throughout the remainder of the case. The laminar edges were then explored and the individual nerve roots were decompressed using a 2 and 3 mm Kerrison punch. There was substantial epidural bleeding that was controlled with bipolar cautery pledgets of Gelfoam every used to tamp not this region. In the end the L3 nerve roots were decompressed far out into the lateral foramen L4 was similarly decompressed neural was considerable scar particularly on the right side along this nerve root L5 was decompressed into  the foramen and the S1 nerve root was mobilized and decompressed to allow removal of the disc space at the L5-S1 space. Then at L5-S1 we started  by doing a total discectomy first on the right side and on the left side. The endplates were rongeured smooth and a toothed curette was used to decorticate the endplates completely in the disc space. Once a thorough discectomy was completed and all the available disc material was removed the interspace was sized for appropriate size spacer. Is felt that a 28 mm long 8 mm tall 12 lordotic spacer was fit best and this was filled with autograft a total of 12 mL of autograft was fit into the interspace at L5-S1. Attention was then turned to L4-L5 were similar discectomy was completed and the interspace was sized again an 8 mm tall 12 lordotic 28 mm long cage was fitted into this interspace and a total of 12 mL of bone graft was fitted into the interspace along with the cages. At L3-L4 where there was not much lordosis after decorticating the endplates is felt that a 9 mm spacer with 4 of lordosis measuring 29 mm in length would fit best and this was fitted into the interspace along with 9 mL of bone graft.  Once the interbody grafting procedure was completed pedicle entry sites were chosen at L3 L4 L5 and the sacrum using fluoroscopic guidance on the left side the pedicle of L4 was cracked partially and the cracked portion was removed nonetheless at L5 were able to place 6.5 x 45 mm pedicle screws under direct visualization similarly at L4 6.5 x 45 mm screws were placed and at L3 6.5 x 45 mm screws were also placed. In the sacrum 6.5 x 50 mm screws were placed. Once the screws were placed 100 mm precontoured rods were used to connect the L3 to the sacral screws in a neutral construct. At this point the lateral gutters which had been packed off at the packing was removed and the remainder of the autograft was fitted onto the right side however there was not enough graft for the left side decided to use at this point a detox bone sponge on the left side for a total volume of 10 mL of the bone sponge. The system was  torqued into its final position care was taken into make sure that the individual nerves L3 L4 L5 and the sacrum bilaterally were well decompressed and hemostasis was well achieved. Final radiographs were obtained in AP and lateral projection and then the lumbar dorsal fascia was closed over large Hemovac drain. #1 Vicryls used for this purpose. 25 mL of half percent Marcaine was injected into the paraspinous fascia. 2-0 vicryl was used in the subcutaneous tissues and 3-0 vicryl  was used to close subcutaneous take her skin. 2200 mL of blood loss was estimated for this procedure and 850 mL of Cell Saver blood was returned to the patient.

## 2015-04-03 NOTE — Transfer of Care (Signed)
Immediate Anesthesia Transfer of Care Note  Patient: Gregory Foley  Procedure(s) Performed: Procedure(s): Lumbar three-four lumbar four-five lumbar five sacral-one  Posterior lumbar interbody fusion, hardware removal  (N/A)  Patient Location: PACU  Anesthesia Type:General  Level of Consciousness: awake and patient cooperative  Airway & Oxygen Therapy: Patient Spontanous Breathing and Patient connected to nasal cannula oxygen  Post-op Assessment: Report given to RN and Post -op Vital signs reviewed and stable  Post vital signs: Reviewed and stable  Last Vitals:  Filed Vitals:   04/03/15 0856  BP: 127/86  Pulse: 67  Temp: 36.6 C  Resp: 20    Complications: No apparent anesthesia complications

## 2015-04-03 NOTE — Anesthesia Procedure Notes (Signed)
Procedure Name: Intubation Date/Time: 04/03/2015 11:45 AM Performed by: Adonis Housekeeper Pre-anesthesia Checklist: Patient identified, Emergency Drugs available, Suction available, Patient being monitored and Timeout performed Patient Re-evaluated:Patient Re-evaluated prior to inductionOxygen Delivery Method: Circle system utilized Preoxygenation: Pre-oxygenation with 100% oxygen Intubation Type: IV induction Ventilation: Mask ventilation without difficulty Laryngoscope Size: Mac and 3 Grade View: Grade I Tube type: Oral Tube size: 7.5 mm Number of attempts: 1 Placement Confirmation: ETT inserted through vocal cords under direct vision,  positive ETCO2 and breath sounds checked- equal and bilateral Secured at: 22 cm Tube secured with: Tape Dental Injury: Teeth and Oropharynx as per pre-operative assessment

## 2015-04-03 NOTE — Progress Notes (Signed)
Pt arrived on unit 2015hrs from PACU, A&O with only minor back discomfort, pt oriented to room, transfer orders implemented.

## 2015-04-03 NOTE — H&P (Signed)
CHIEF COMPLAINT:                                          Pain in the lower back and legs and in the left hand and in the neck.  HISTORY OF PRESENT ILLNESS:                     Gregory Foley is a 59 year old, right-handed individual who tells me that he has been having problems with his low back since before he had his last surgery in 2012 and even since he has had that surgery.  In addition, he has problems with his left hand and left side of his neck.  He had been seen and treated in the past by Dr. Candise Che in [place] and had neck surgery in 2009 with a two-level fusion and then he had two back operations in 2011 and 2012, the last of which resulted in a fusion.  He tells me that Dr. Krista Blue had done this operation and he is still having some residual symptoms but ultimately Dr. Krista Blue died.  He has been dealing with the back pain and leg pain, but he notes that his symptoms seem to be somewhat worsening.  In addition, he has had some return of left-sided cervical radicular symptoms into the third and fourth digits on that left side.  He notes that the index and long fingers will also tend to be numb.  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  He has been treated conservatively for all these processes but he has not had any followup of his spine until this point.  IMAGING STUDIES:                                          Today in the office to further his workup I obtained a new lateral flexion/extension film of the neck in addition to new AP and lateral radiograph of the lumbar spine.  The cervical spine films demonstrate that the patient has a solid arthrodesis from C5 to C7.  He has a grade I anterolisthesis of C7 on T1 that does not reduce when he is in extension and the anterolisthesis measures approximately 8 mm from the posterior edge of the vertebra of C7  to the posterior edge of T1.  The alignment in the coronal plane is quite normal.  The lumbar spine films are reviewed and these demonstrate that the patient has significant degenerative changes at L3-4, L4-5 and L5-S1 with loss of disk height and straightening of the lumbar spine with reversal of the normal lordotic curve and a kyphosis that forms at about the level of L3-L4.  He has what appears to be a posterior spire plate between L3 and L4 but this does not result in any fixation as there is motion between the L3 and the L4 vertebrae anteriorly between flexion and extension.  In addition, he has advanced degenerative changes in the disk space causing a bit of hyperlordosis from L3 to the sacrum.  His alignment in the coronal plane on the AP view is quite acceptable.  PAST  MEDICAL HISTORY:                                His past medical history reveals that his general health has been very good.  He reports no significant medical problems.  . Medications and Allergies:  His current medications include only gabapentin 100 mg three times a day to help with sleep and the pain in his right hands, and he also uses Aleve a couple times a day.  SOCIAL HISTORY:                                            Personal history:  He does not smoke nor does he use any alcohol.  PHYSICAL EXAMINATION:                                Height and weight have been stable at 6 feet, 227 pounds.  NEUROLOGICAL EXAMINATION:                       His physical exam reveals that he stands straight and erect and he moves about without any antalgia.  His neck and the upper extremities reveal that he has a turning radius of about 30 degrees left and right.  He flexes and extends without difficulty.  Motor strength is good in the deltoids, the biceps, triceps, and the grips on the left side.  On the right side it is difficult to test the grip because of the hand difficulties that he has and his intrinsic strength is markedly limited on  that right side.  No gross atrophy, however, is noted in the hand itself.  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  This appears to have advanced itself from his last MRI of the cervical spine which was performed in 11/2010.  Given the fact that he is having less cervical symptoms I believe repeating the MRI would be of benefit at this time to see if the degree of stenosis has worsened to any substantial degree.    The new MRI demonstrates that he has had central canal decompression at L3-4.  He has a spinous process plate that has been placed, but there is no evidence of a fusion here.  The facet joints are still evident, as is the disc space, without any evidence of arthrodesis.  Plain x-rays reveal that there is still motion across the L3-4 level.  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  The disc space itself is moderately/severely degenerated also.  In light of the changes at L3-4, L4-5, and L5-S1, I indicated that if surgery is contemplated to improve the situation in his lumbar spine, it would require a surgical decompression and stabilization from L3  to the sacrum.  This would be done with a posterior lumbar interbody technique at each of the levels:  L3-4, L4-5, and L5-S1; decompression of the left-sided L5 nerve root, particularly, but also the L4 and the L3 nerve roots and the S1 nerve root inferiorly.  The surgery itself would take about six hours in time.  There would be pedicle fixation from L3 down to S1.  There would also be a posterolateral fusion that would be formed.  The spinous process plate would be removed.  The surgery itself is a substantial undertaking.  However, given the fact that he has  a complex history of back pain with radicular changes, I believe that this is what would be required to get some improvement in his lumbar spine.     IMPRESSION/PLAN:                             I discussed with Mr. Wold the major risks and considerations of the surgery, namely, that of a nonunion and the potential for spinal fluid leakage, each of which could extend the hospital stay.  Notwithstanding that, given the fact that he has been feeling increasing frustration with his back and his limitations of activities because of it, the surgery is not unreasonable to consider given the fact that he has a pseudoarthrosis, he has nerve root compromise, and significant radicular pain on top of that.  He is admitted for surgery today.

## 2015-04-03 NOTE — Anesthesia Postprocedure Evaluation (Signed)
Anesthesia Post Note  Patient: Gregory Foley  Procedure(s) Performed: Procedure(s) (LRB): Lumbar three-four lumbar four-five lumbar five sacral-one  Posterior lumbar interbody fusion, hardware removal  (N/A)  Anesthesia type: general  Patient location: PACU  Post pain: Pain level controlled  Post assessment: Patient's Cardiovascular Status Stable  Last Vitals:  Filed Vitals:   04/03/15 2000  BP:   Pulse: 73  Temp:   Resp: 10    Post vital signs: Reviewed and stable  Level of consciousness: sedated  Complications: No apparent anesthesia complications

## 2015-04-03 NOTE — Anesthesia Preprocedure Evaluation (Addendum)
Anesthesia Evaluation  Patient identified by MRN, date of birth, ID band Patient awake    Reviewed: Allergy & Precautions, NPO status , Patient's Chart, lab work & pertinent test results  Airway Mallampati: I  TM Distance: >3 FB Neck ROM: Full   Comment: Limited neck movement side to side, not front to back.  Dental  (+) Teeth Intact   Pulmonary neg pulmonary ROS,    breath sounds clear to auscultation       Cardiovascular negative cardio ROS   Rhythm:Regular Rate:Normal     Neuro/Psych negative psych ROS   GI/Hepatic negative GI ROS, Neg liver ROS,   Endo/Other    Renal/GU   negative genitourinary   Musculoskeletal  (+) Arthritis , Osteoarthritis,    Abdominal   Peds negative pediatric ROS (+)  Hematology negative hematology ROS (+)   Anesthesia Other Findings   Reproductive/Obstetrics negative OB ROS                            Lab Results  Component Value Date   WBC 4.9 03/24/2015   HGB 16.0 03/24/2015   HCT 47.0 03/24/2015   MCV 86.4 03/24/2015   PLT 142* 03/24/2015   No results found for: CREATININE, BUN, NA, K, CL, CO2 No results found for: INR, PROTIME  EKG: normal sinus rhythm.   Anesthesia Physical Anesthesia Plan  ASA: II  Anesthesia Plan: General   Post-op Pain Management:    Induction: Intravenous  Airway Management Planned: Oral ETT  Additional Equipment:   Intra-op Plan:   Post-operative Plan: Extubation in OR  Informed Consent: I have reviewed the patients History and Physical, chart, labs and discussed the procedure including the risks, benefits and alternatives for the proposed anesthesia with the patient or authorized representative who has indicated his/her understanding and acceptance.   Dental advisory given  Plan Discussed with: CRNA  Anesthesia Plan Comments:         Anesthesia Quick Evaluation

## 2015-04-04 LAB — CBC
HCT: 38 % — ABNORMAL LOW (ref 39.0–52.0)
Hemoglobin: 12.7 g/dL — ABNORMAL LOW (ref 13.0–17.0)
MCH: 29.5 pg (ref 26.0–34.0)
MCHC: 33.4 g/dL (ref 30.0–36.0)
MCV: 88.4 fL (ref 78.0–100.0)
PLATELETS: 120 10*3/uL — AB (ref 150–400)
RBC: 4.3 MIL/uL (ref 4.22–5.81)
RDW: 13.9 % (ref 11.5–15.5)
WBC: 15.1 10*3/uL — ABNORMAL HIGH (ref 4.0–10.5)

## 2015-04-04 LAB — BASIC METABOLIC PANEL
Anion gap: 11 (ref 5–15)
BUN: 25 mg/dL — AB (ref 6–20)
CO2: 24 mmol/L (ref 22–32)
CREATININE: 1.84 mg/dL — AB (ref 0.61–1.24)
Calcium: 8.1 mg/dL — ABNORMAL LOW (ref 8.9–10.3)
Chloride: 107 mmol/L (ref 101–111)
GFR calc Af Amer: 45 mL/min — ABNORMAL LOW (ref >60)
GFR, EST NON AFRICAN AMERICAN: 39 mL/min — AB (ref >60)
GLUCOSE: 164 mg/dL — AB (ref 65–99)
Potassium: 4.7 mmol/L (ref 3.5–5.1)
SODIUM: 142 mmol/L (ref 135–145)

## 2015-04-04 NOTE — Progress Notes (Signed)
PT Cancellation Note  Patient Details Name: Gregory Foley MRN: [id number] DOB: [birth year]   Cancelled Treatment:    Reason Eval/Treat Not Completed: Noted that pt does not have back brace present in room. Will check back to complete evaluation when brace arrives.   Conni Slipper 04/04/2015, 9:45 AM   Conni Slipper, PT, DPT Acute Rehabilitation Services Pager: [phone]

## 2015-04-04 NOTE — Progress Notes (Signed)
OT Cancellation Note  Patient Details Name: Wendel Homeyer MRN: [id number] DOB: 10-24-1955   Cancelled Treatment:    Reason Eval/Treat Not Completed: Other (comment) Pt with order for back brace but no brace in room. Will hold OT evaluation until pt has back brace.   Pilar Grammes 04/04/2015, 9:14 AM

## 2015-04-04 NOTE — Clinical Social Work Note (Signed)
CSW Consult Acknowledged:   CSW received a consult for SNF placement. CSW awaiting PT/OT evaluation to determine the appropriate level of care.      Trevis Eden, MSW, LCSWA [phone]  

## 2015-04-04 NOTE — Progress Notes (Signed)
Patient ID: Gregory Foley, male   DOB: 1956/05/23, 59 y.o.   MRN: [id number] vitalsigns are stable but BUN and CR are increased 1.84 and 25. Will stop Toradol  HCT is 38.  Mobilize today.

## 2015-04-04 NOTE — Care Management Note (Signed)
Case Management Note  Patient Details  Name: Gregory Foley MRN: [id number] Date of Birth: May 10, 1956  Subjective/Objective:                    Action/Plan: Patient admitted with lumbar stenosis. Pt underwent a L3-S1 PLIF. PT recommending HH PT and DME. CM will continue to follow for discharge needs.   Expected Discharge Date:                  Expected Discharge Plan:  Home w Home Health Services  In-House Referral:     Discharge planning Services     Post Acute Care Choice:    Choice offered to:     DME Arranged:    DME Agency:     HH Arranged:    HH Agency:     Status of Service:  In process, will continue to follow  Medicare Important Message Given:    Date Medicare IM Given:    Medicare IM give by:    Date Additional Medicare IM Given:    Additional Medicare Important Message give by:     If discussed at Long Length of Stay Meetings, dates discussed:    Additional Comments:  Kermit Balo, RN 04/04/2015, 3:21 PM

## 2015-04-04 NOTE — Evaluation (Addendum)
Occupational Therapy Evaluation Patient Details Name: Gregory Foley MRN: [id number] DOB: [birth year] Today's Date: 04/04/2015    History of Present Illness Pt is a 59 y.o. male who presents s/p L3-L5 laminectomy with decompression of L3-S1 nerve roots bilaterally on 04/03/15.   Clinical Impression   Pt s/p above. Pt independent with ADLs, PTA. Feel pt will benefit from acute OT to increase independence prior to d/c. Plan to practice LB dressing next session.    Follow Up Recommendations  No OT follow up;Supervision - Intermittent    Equipment Recommendations  3 in 1 bedside comode;Other (comment) (AE)    Recommendations for Other Services       Precautions / Restrictions Precautions Precautions: Fall;Back Precaution Booklet Issued: No Precaution Comments: reviewed precautions Required Braces or Orthoses: Spinal Brace Spinal Brace: Other (comment) (doffed in sitting; already on in session) Restrictions Weight Bearing Restrictions: No      Mobility Bed Mobility Overal bed mobility: Needs Assistance Bed Mobility: Rolling;Sit to Sidelying Rolling: Supervision     Sit to sidelying: Supervision General bed mobility comments: cues for technique.  Transfers Overall transfer level: Needs assistance Equipment used: Rolling walker (2 wheeled) Transfers: Sit to/from Stand Sit to Stand: Min guard Stand pivot transfers: Min guard       General transfer comment: took a few pivotal steps to bed.    Balance Used RW for pivotal steps to bed. Balance not formally assessed.                               ADL Overall ADL's : Needs assistance/impaired     Grooming: Set up;Supervision/safety;Sitting               Lower Body Dressing: Maximal assistance;Sit to/from stand   Toilet Transfer: Min guard;RW (pivotal steps to bed; sit to stand from chair)           Functional mobility during ADLs: Min guard;Rolling walker (few pivotal steps to bed) General  ADL Comments: Discussed incorporating precautions into functional activities. Educated on back brace. Educated on AE and LB dressing technique. Explained what pt could use for toilet aide if needed (suggested wipes). Recommended not sitting up longer than 45 minutes at a time. Discussed 3 in 1/options for shower chair.     Vision     Perception     Praxis      Pertinent Vitals/Pain Pain Assessment: 0-10 Pain Score: 5  Pain Location: back Pain Descriptors / Indicators: Aching Pain Intervention(s): Monitored during session;Repositioned     Hand Dominance Right   Extremity/Trunk Assessment Upper Extremity Assessment Upper Extremity Assessment: Overall WFL for tasks assessed   Lower Extremity Assessment Lower Extremity Assessment: Defer to PT evaluation     Communication Communication Communication: No difficulties   Cognition Arousal/Alertness: Awake/alert Behavior During Therapy: WFL for tasks assessed/performed Overall Cognitive Status: Within Functional Limits for tasks assessed                     General Comments       Exercises       Shoulder Instructions      Home Living Family/patient expects to be discharged to:: Private residence Living Arrangements: Spouse/significant other Available Help at Discharge: Family;Available 24 hours/day Type of Home: House Home Access: Level entry     Home Layout: Two level Alternate Level Stairs-Number of Steps: 7 steps; landing; 7 steps Alternate Level Stairs-Rails: Left Bathroom Shower/Tub: Walk-in shower  Bathroom Toilet: Pharmacist, community: Yes   Home Equipment: None          Prior Functioning/Environment Level of Independence: Independent             OT Diagnosis: Acute pain   OT Problem List: Decreased range of motion;Pain;Decreased knowledge of precautions;Decreased knowledge of use of DME or AE;Decreased activity tolerance   OT Treatment/Interventions: Self-care/ADL  training;DME and/or AE instruction;Therapeutic activities;Balance training;Patient/family education    OT Goals(Current goals can be found in the care plan section) Acute Rehab OT Goals Patient Stated Goal: get back to everything he was doing before OT Goal Formulation: With patient Time For Goal Achievement: 04/11/15 Potential to Achieve Goals: Good ADL Goals Pt Will Perform Lower Body Dressing: with set-up;sit to/from stand;with adaptive equipment Pt Will Transfer to Toilet: with modified independence;ambulating Pt Will Perform Toileting - Clothing Manipulation and hygiene: sit to/from stand;with modified independence Additional ADL Goal #1: Pt will independently verbalize 3/3 back precautions and maintain during session.  OT Frequency: Min 2X/week   Barriers to D/C:            Co-evaluation              End of Session Equipment Utilized During Treatment: Gait belt;Rolling walker;Back brace Nurse Communication:Other (comment) (dizzy)  Activity Tolerance: Other (comment) (dizziness) Patient left: in bed;with call bell/phone within reach;with family/visitor present   Time: 1610-9604 OT Time Calculation (min): 16 min Charges:  OT General Charges $OT Visit: 1 Procedure OT Evaluation $Initial OT Evaluation Tier I: 1 Procedure G-CodesEarlie Raveling OTR/L [id number] 04/04/2015, 3:50 PM

## 2015-04-04 NOTE — Evaluation (Signed)
Physical Therapy Evaluation Patient Details Name: Gregory Foley MRN: [id number] DOB: 1956-04-10 Today's Date: 04/04/2015   History of Present Illness  Pt is a 59 y/o male who presents s/p L3-L5 laminectomy with decompression of L3-S1 nerve roots bilaterally on 04/03/15.  Clinical Impression  Pt admitted with above diagnosis. Pt currently with functional limitations due to the deficits listed below (see PT Problem List). At the time of PT eval pt was limited by dizziness and feeling of syncope coming on with standing activity. Pt has been supine for most of the day awaiting brace delivery. He was able to SPT to chair and reports feeling "great" at end of session. Anticipate that mobility will progress well as dizziness improves with postural changes. Pt will benefit from skilled PT to increase their independence and safety with mobility to allow discharge to the venue listed below.       Follow Up Recommendations Home health PT;Supervision for mobility/OOB    Equipment Recommendations  Rolling walker with 5" wheels;3in1 (PT)    Recommendations for Other Services       Precautions / Restrictions Precautions Precautions: Fall;Back Precaution Booklet Issued: No Precaution Comments: Discussed 3/3 back precautions and education pt/wife Required Braces or Orthoses: Spinal Brace Spinal Brace: Applied in sitting position Restrictions Weight Bearing Restrictions: No      Mobility  Bed Mobility Overal bed mobility: Needs Assistance Bed Mobility: Rolling;Sidelying to Sit Rolling: Min guard Sidelying to sit: Min guard       General bed mobility comments: Close guard for safety. VC's for maintenance of back precautions.   Transfers Overall transfer level: Needs assistance Equipment used: Rolling walker (2 wheeled) Transfers: Sit to/from [employer] Sit to Stand: Min guard Stand pivot transfers: Min guard       General transfer comment: Sit<>stand x3 during  session. Pt with dizziness and feeling of passing out with first 2 stands. Pt SPT bed to chair instead of attempting ambulation due to dizziness with postural changes.   Ambulation/Gait                Stairs            Wheelchair Mobility    Modified Rankin (Stroke Patients Only)       Balance Overall balance assessment: No apparent balance deficits (not formally assessed)                                           Pertinent Vitals/Pain Pain Assessment: No/denies pain    Home Living Family/patient expects to be discharged to:: Private residence Living Arrangements: Spouse/significant other Available Help at Discharge: Family;Available 24 hours/day Type of Home: House Home Access: Level entry     Home Layout: Two level Home Equipment: None      Prior Function Level of Independence: Independent               Hand Dominance   Dominant Hand: Right    Extremity/Trunk Assessment   Upper Extremity Assessment: Defer to OT evaluation           Lower Extremity Assessment: Generalized weakness      Cervical / Trunk Assessment: Normal  Communication   Communication: No difficulties  Cognition Arousal/Alertness: Awake/alert Behavior During Therapy: WFL for tasks assessed/performed Overall Cognitive Status: Within Functional Limits for tasks assessed  General Comments      Exercises        Assessment/Plan    PT Assessment Patient needs continued PT services  PT Diagnosis Difficulty walking;Acute pain   PT Problem List Decreased strength;Decreased range of motion;Decreased activity tolerance;Decreased balance;Decreased mobility;Decreased knowledge of use of DME;Decreased safety awareness;Decreased knowledge of precautions;Pain  PT Treatment Interventions DME instruction;Stair training;Gait training;Functional mobility training;Therapeutic activities;Therapeutic exercise;Neuromuscular  re-education;Patient/family education   PT Goals (Current goals can be found in the Care Plan section) Acute Rehab PT Goals Patient Stated Goal: Home PT Goal Formulation: With patient/family Time For Goal Achievement: 04/11/15 Potential to Achieve Goals: Good    Frequency Min 5X/week   Barriers to discharge        Co-evaluation               End of Session Equipment Utilized During Treatment: Back brace Activity Tolerance: Treatment limited secondary to medical complications (Comment) (Dizziness, feeling of syncope) Patient left: in chair;with call bell/phone within reach;with family/visitor present Nurse Communication: Mobility status         Time: 1410-1450 PT Time Calculation (min) (ACUTE ONLY): 40 min   Charges:   PT Evaluation $Initial PT Evaluation Tier I: 1 Procedure PT Treatments $Therapeutic Activity: 23-37 mins   PT G Codes:        Conni Slipper Apr 10, 2015, 3:15 PM   Conni Slipper, PT, DPT Acute Rehabilitation Services Pager: [phone]

## 2015-04-05 ENCOUNTER — Inpatient Hospital Stay (HOSPITAL_COMMUNITY): Payer: Commercial Managed Care - HMO

## 2015-04-05 DIAGNOSIS — M4806 Spinal stenosis, lumbar region: Secondary | ICD-10-CM

## 2015-04-05 DIAGNOSIS — N179 Acute kidney failure, unspecified: Secondary | ICD-10-CM | POA: Clinically undetermined

## 2015-04-05 DIAGNOSIS — D62 Acute posthemorrhagic anemia: Secondary | ICD-10-CM

## 2015-04-05 LAB — CBC
HCT: 28.4 % — ABNORMAL LOW (ref 39.0–52.0)
HEMOGLOBIN: 9.7 g/dL — AB (ref 13.0–17.0)
MCH: 29.5 pg (ref 26.0–34.0)
MCHC: 34.2 g/dL (ref 30.0–36.0)
MCV: 86.3 fL (ref 78.0–100.0)
Platelets: 95 10*3/uL — ABNORMAL LOW (ref 150–400)
RBC: 3.29 MIL/uL — AB (ref 4.22–5.81)
RDW: 13.9 % (ref 11.5–15.5)
WBC: 10.9 10*3/uL — ABNORMAL HIGH (ref 4.0–10.5)

## 2015-04-05 LAB — BASIC METABOLIC PANEL
Anion gap: 8 (ref 5–15)
BUN: 33 mg/dL — AB (ref 6–20)
CHLORIDE: 107 mmol/L (ref 101–111)
CO2: 25 mmol/L (ref 22–32)
Calcium: 8 mg/dL — ABNORMAL LOW (ref 8.9–10.3)
Creatinine, Ser: 1.95 mg/dL — ABNORMAL HIGH (ref 0.61–1.24)
GFR calc Af Amer: 42 mL/min — ABNORMAL LOW (ref >60)
GFR calc non Af Amer: 36 mL/min — ABNORMAL LOW (ref >60)
GLUCOSE: 108 mg/dL — AB (ref 65–99)
POTASSIUM: 4.1 mmol/L (ref 3.5–5.1)
Sodium: 140 mmol/L (ref 135–145)

## 2015-04-05 LAB — SODIUM, URINE, RANDOM: Sodium, Ur: 42 mmol/L

## 2015-04-05 LAB — SAVE SMEAR

## 2015-04-05 LAB — CREATININE, URINE, RANDOM: Creatinine, Urine: 122.74 mg/dL

## 2015-04-05 NOTE — Progress Notes (Signed)
Physical Therapy Treatment Patient Details Name: Gregory Foley MRN: [id number] DOB: 1956/06/06 Today's Date: 04/05/2015    History of Present Illness Pt is a 59 y/o male who presents s/p L3-L5 laminectomy with decompression of L3-S1 nerve roots bilaterally on 04/03/15.    PT Comments    Pt progressing towards physical therapy goals. Was able to tolerate gait training with no reports of dizziness or feeling of pre-syncope. Pt and wife are anticipating d/c tomorrow. Pt will need to practice stairs prior to d/c.   Follow Up Recommendations  Home health PT;Supervision for mobility/OOB     Equipment Recommendations  Rolling walker with 5" wheels;3in1 (PT)    Recommendations for Other Services       Precautions / Restrictions Precautions Precautions: Fall;Back Precaution Booklet Issued: No Precaution Comments: Discussed 3/3 back precautions and education pt/wife Required Braces or Orthoses: Spinal Brace Spinal Brace: Applied in sitting position Restrictions Weight Bearing Restrictions: No    Mobility  Bed Mobility Overal bed mobility: Needs Assistance Bed Mobility: Rolling;Sidelying to Sit Rolling: Supervision Sidelying to sit: Supervision     Sit to sidelying: Supervision General bed mobility comments: Supervision for safety. VC's for maintenance of back precautions.   Transfers Overall transfer level: Needs assistance Equipment used: Rolling walker (2 wheeled) Transfers: Sit to/from Stand Sit to Stand: Min guard         General transfer comment: No physical assist required. Min guard for safety as pt powered-up to full stand.   Ambulation/Gait Ambulation/Gait assistance: Min guard;Supervision Ambulation Distance (Feet): 500 Feet Assistive device: Rolling walker (2 wheeled) Gait Pattern/deviations: Step-through pattern;Decreased stride length Gait velocity: Decreased Gait velocity interpretation: Below normal speed for age/gender General Gait Details: Pt was  able to ambulate with min guard progressing to supervision for safety. Pt was motivated for distance and reports decreased pain after gait training.    Stairs            Wheelchair Mobility    Modified Rankin (Stroke Patients Only)       Balance Overall balance assessment: No apparent balance deficits (not formally assessed)                                  Cognition Arousal/Alertness: Awake/alert Behavior During Therapy: WFL for tasks assessed/performed Overall Cognitive Status: Within Functional Limits for tasks assessed                      Exercises      General Comments        Pertinent Vitals/Pain Pain Assessment: Faces Pain Score:  ("it's not bad" no numerical value given) Faces Pain Scale: Hurts a little bit Pain Location: Incision Pain Descriptors / Indicators: Operative site guarding;Discomfort Pain Intervention(s): Limited activity within patient's tolerance;Monitored during session;Repositioned    Home Living                      Prior Function            PT Goals (current goals can now be found in the care plan section) Acute Rehab PT Goals Patient Stated Goal: Home PT Goal Formulation: With patient/family Time For Goal Achievement: 04/11/15 Potential to Achieve Goals: Good Progress towards PT goals: Progressing toward goals    Frequency  Min 5X/week    PT Plan Current plan remains appropriate    Co-evaluation  End of Session Equipment Utilized During Treatment: Back brace Activity Tolerance: Patient tolerated treatment well Patient left: in chair;with call bell/phone within reach;with family/visitor present     Time: 1022-1042 PT Time Calculation (min) (ACUTE ONLY): 20 min  Charges:  $Gait Training: 8-22 mins                    G Codes:      Conni Slipper 19-Apr-2015, 12:41 PM   Conni Slipper, PT, DPT Acute Rehabilitation Services Pager: [phone]

## 2015-04-05 NOTE — Progress Notes (Signed)
Patient ID: Gregory HaloCecil Foley, male   DOB: 02/04/1956, 59 y.o.   MRN: [id number] Vital signs are stable Creatinine continues to increase now 1.94 Hematocrit decreased to 29 secondary to acute blood loss anemia from surgery Incision remains clean and dry Motor function remained stable Will ask for hospitalist to assess regarding worsening creatinine. Continues mobilization

## 2015-04-05 NOTE — Consult Note (Signed)
[facility] Medical Consultation  Gregory Foley GMW:[id number] DOB: [birth year] DOA: 04/03/2015 PCP: Piedad Climes, PA-C   Requesting physician: Dr. Barnett Abu Date of consultation: 04/05/2015 Reason for consultation: General medical management, rising creatinine  Impression/Recommendations Active Problems:   Lumbar stenosis with neurogenic claudication   Acute kidney injury (HCC)   Postoperative anemia due to acute blood loss   Lumbar Stenosis with neurogenic claudication Management per primary team, Dr. Danielle Dess.   Acute Kidney Injury Baseline creatinine from 06/06/2011 is 1.09. (Obtained by calling previous PCP in [facility]) Patient has a history of kidney stones, but no history of chronic kidney disease. Patient complains of urinary hesitancy after surgery-now improving. I suspect this is actually acute on chronic. As the patient has a history of heavy NSAID use.  Will order renal ultrasound, bladder scan, strict I's and O's, check FENA.   We will avoid all potentially nephrotoxic medications including NSAIDs, gabapentin, and will change Robaxin to oral only rather than IV.  Will recheck bmet again in the morning.     Acute blood loss anemia Hemoglobin on admission was 14.3. Now 9.7. This is after 2200 ml blood loss during surgery and significant IV hydration. Patient denies dark stools, bruising, epistaxis.  States he has had a minimal amount of hematuria postop.    Will check stool for guaiac. Continue to monitor CBC. He does not appear to be on any pharmacologic DVT prophylaxis. Would transfuse if he becomes symptomatic or hemoglobin drops below 7.   Thrombocytopenia No baseline available. Platelets have dropped from 142 on 9/30 to 94 today.  Likely an acute phase reaction.   Will send a smear.  He is not on pharmacologic DVT prophylaxis.    TRHwill followup again tomorrow. Please contact me if I can be of assistance in the meanwhile. Thank  you for this consultation.  Chief Complaint: from Dr. Danielle Dess - rising creatinine.  HPI:  Gregory Foley is a 59 year old male who has a significant history of chronic cervical and lumbar stenosis. He underwent laminectomy in 2008. He continued to have significant pain daily which he treated with Aleve. He told his primary care physician he did not want to take narcotic medications. He was admitted to [facility] on 10/10 with pain in the lower back, legs, left hand and neck. He underwent neurosurgery on 10/10 PM. During which he lost approximately 2200 mL of blood.  He has been recovering well but has experienced some mild hematuria and urinary hesitancy.  Review of Systems:  In addition to the HPI above,  No Fever-chills, No Headache, No changes with Vision or hearing, No problems swallowing food or Liquids, No Chest pain, Cough or Shortness of Breath, No Abdominal pain, No Nausea or Vomiting, Bowel movements are regular, No Blood in stool  No new skin rashes or bruises, No new joints pains-aches,  No new weakness, tingling, numbness in any extremity, No recent weight gain or loss, A full 10 point Review of Systems was done, except as stated above, all other Review of Systems were negative.  Past Medical History  Diagnosis Date  . 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 Herniation of lumbar intervertebral disc     and Cervical Neck  . Arthritis     Back  . Kidney stone     Last 25 years ago  . Elevated blood pressure     pt states that he was treated for a short time  for elevated BP but PCP didn't think he needed it   Past Surgical History  Procedure Laterality Date  . Cervical fusion      2009  . Reattachment hand      1981  . Inguinal hernia repair      x4  . Lumbar laminectomy      2011  . Medial facetectomy    . Colonoscopy     Social History:  reports that he has never smoked. He has never used smokeless tobacco. He reports that he does not  drink alcohol or use illicit drugs.  lives at home with his wife. Is independent with ADLs.  No Known Allergies Family History  Problem Relation Age of Onset  . Arthritis Mother     Living  . Prostate cancer Father [id number]    Deceased  . Stroke Father   . Bladder Cancer Brother   . Alzheimer's disease Maternal Grandfather   . Heart disease Other     Maternal Aunts & Uncles  . Cancer Other     Paternal Aunts & Uncles  . Heart disease Sister     x1  . Healthy Brother     x3  . Psychiatric Illness Brother     x1    Prior to Admission medications   Medication Sig Start Date End Date Taking? Authorizing Provider  gabapentin (NEURONTIN) 100 MG capsule Take 1 capsule (100 mg total) by mouth 3 (three) times daily. TAKE 100 MG IN THE AM, 100 MG AFTERNOON AND 300 MG AT NIGHT 03/06/15  Yes Waldon MerlWilliam C Martin, PA-C  naproxen sodium (ALEVE) 220 MG tablet Take 220 mg by mouth 2 (two) times daily as needed.   Yes Historical Provider, MD   Physical Exam: Blood pressure 129/79, pulse 98, temperature 98.9 F (37.2 C), temperature source Oral, resp. rate 20, height 6' (1.829 m), weight 102.9 kg (226 lb 13.7 oz), SpO2 98 %. Filed Vitals:   04/05/15 1036  BP: 129/79  Pulse: 98  Temp: 98.9 F (37.2 C)  Resp: 20     General:  Well-developed, well-nourished appearing male, sitting in a recliner chair, no apparent distress, wife at bedside.  Eyes: Sclera clear, pupils are equal and round  ENT: Ears and nose appear normal without erythema or exudates, there is a small dot of erythema in his oropharynx just above his uvula. No exudates  Neck: Supple, no frank lymphadenopathy  Cardiovascular: Regular rate and rhythm no obvious murmurs rubs or gallops.  Respiratory: Clear to auscultation, no frank wheezes crackles or rales  Abdomen: Soft, nondistended, nontender, no masses  Skin: Tanned, bandage in central and lower back is clean and dry. No bruises or superficial hematomas  noted  Musculoskeletal: Able to move all 4 extremities  Psychiatric: Cooperative, appropriate, alert and oriented, well-groomed  Neurologic: No acute focal abnormalities.  Labs on Admission:  Basic Metabolic Panel:  Recent Labs Lab 04/03/15 1502 04/03/15 1737 04/04/15 0434 04/05/15 0650  NA 141 139 142 140  K 4.4 4.3 4.7 4.1  CL  --   --  107 107  CO2  --   --  24 25  GLUCOSE 113* 138* 164* 108*  BUN  --   --  25* 33*  CREATININE  --   --  1.84* 1.95*  CALCIUM  --   --  8.1* 8.0*   CBC:  Recent Labs Lab 04/03/15 1502 04/03/15 1737 04/04/15 0434 04/05/15 0650  WBC  --   --  15.1* 10.9*  HGB 14.3 11.6* 12.7* 9.7*  HCT 42.0 34.0* 38.0* 28.4*  MCV  --   --  88.4 86.3  PLT  --   --  120* 95*    Radiological Exams on Admission: Dg Lumbar Spine 2-3 Views  04/03/2015  CLINICAL DATA:  Status post lumbar spine fusion. EXAM: DG C-ARM 61-120 MIN; LUMBAR SPINE - 2-3 VIEW COMPARISON:  None. FINDINGS: There are bilateral pedicle screws with interconnecting rods extending from L3 through S1. There are radiolucent disc spacers well centered maintaining disc space height at L3-L4, L4-L5 and L5-S1. The orthopedic hardware is well-seated and aligned. There is no acute fracture or evidence of an operative complication. IMPRESSION: Operative imaging during posterior lumbar spine fusion from L3 through S1. Electronically Signed   By: Amie [place] M.D.   On: 04/03/2015 18:38   Dg C-arm 1-60 Min  04/03/2015  CLINICAL DATA:  Status post lumbar spine fusion. EXAM: DG C-ARM 61-120 MIN; LUMBAR SPINE - 2-3 VIEW COMPARISON:  None. FINDINGS: There are bilateral pedicle screws with interconnecting rods extending from L3 through S1. There are radiolucent disc spacers well centered maintaining disc space height at L3-L4, L4-L5 and L5-S1. The orthopedic hardware is well-seated and aligned. There is no acute fracture or evidence of an operative complication. IMPRESSION: Operative imaging during posterior  lumbar spine fusion from L3 through S1. Electronically Signed   By: Amie [place] M.D.   On: 04/03/2015 18:38    EKG: NSR with normal QT interval.  Time spent: 45 min  Conley Canal [facility] Pager [phone]  If 7PM-7AM, please contact night-coverage [email or web address] Password [email or web address] 04/05/2015, 12:05 PM

## 2015-04-05 NOTE — Progress Notes (Signed)
Occupational Therapy Treatment Patient Details Name: Gregory Foley MRN: [id number] DOB: 1956/04/09 Today's Date: 04/05/2015    History of present illness Pt is a 59 y.o. male who presents s/p L3-L5 laminectomy with decompression of L3-S1 nerve roots bilaterally on 04/03/15.   OT comments  Pt progressing towards acute OT goals. Focus of session was toilet transfers, bed mobility, and ADL review. Session details below. Spouse present for session. D/c plan remains appropriate.   Follow Up Recommendations  No OT follow up;Supervision - Intermittent    Equipment Recommendations  3 in 1 bedside comode;Other (comment)    Recommendations for Other Services      Precautions / Restrictions Precautions Precautions: Fall;Back Precaution Booklet Issued: Yes (comment) Precaution Comments: reviewed precautions Required Braces or Orthoses: Spinal Brace Spinal Brace: Lumbar corset;Applied in sitting position Restrictions Weight Bearing Restrictions: No       Mobility Bed Mobility Overal bed mobility: Needs Assistance Bed Mobility: Rolling;Sit to Sidelying Rolling: Supervision       Sit to sidelying: Supervision    Transfers Overall transfer level: Needs assistance Equipment used: Rolling walker (2 wheeled) Transfers: Sit to/from Stand Sit to Stand: Min guard         General transfer comment: from recliner, EOB, and 3n1. discussed using pillows to boost seat heights at home    Balance                                   ADL Overall ADL's : Needs assistance/impaired                         Toilet Transfer: Ambulation;Min guard;RW (3n1 over toilet)           Functional mobility during ADLs: Min guard;Rolling walker General ADL Comments: reviewed ADL education and provided handout. Pt practiced transfer to 3n1 to simulate toilet/shower transfers. Discussed technique for showering and advised pt to ask MD regarding clearance to shower. Spouse present  for session.       Vision                     Perception     Praxis      Cognition   Behavior During Therapy: WFL for tasks assessed/performed Overall Cognitive Status: Within Functional Limits for tasks assessed                       Extremity/Trunk Assessment               Exercises     Shoulder Instructions       General Comments      Pertinent Vitals/ Pain       Pain Assessment: 0-10 Pain Score:  ("it's not bad" no numerical value given) Pain Location: back Pain Descriptors / Indicators: Aching Pain Intervention(s): Limited activity within patient's tolerance;Monitored during session;Repositioned  Home Living                                          Prior Functioning/Environment              Frequency Min 2X/week     Progress Toward Goals  OT Goals(current goals can now be found in the care plan section)  Progress towards OT goals: Progressing toward goals  Acute Rehab  OT Goals Patient Stated Goal: get back to everything he was doing before OT Goal Formulation: With patient Time For Goal Achievement: 04/11/15 Potential to Achieve Goals: Good ADL Goals Pt Will Perform Lower Body Dressing: with set-up;sit to/from stand;with adaptive equipment Pt Will Transfer to Toilet: with modified independence;ambulating Pt Will Perform Toileting - Clothing Manipulation and hygiene: sit to/from stand;with modified independence Additional ADL Goal #1: Pt will independently verbalize 3/3 back precautions and maintain during session.  Plan Discharge plan remains appropriate    Co-evaluation                 End of Session Equipment Utilized During Treatment: Rolling walker;Back brace   Activity Tolerance Patient tolerated treatment well   Patient Left in bed;with call bell/phone within reach;with bed alarm set;with SCD's reapplied;with family/visitor present   Nurse Communication          Time: 1610-9604 OT  Time Calculation (min): 18 min  Charges: OT General Charges $OT Visit: 1 Procedure OT Treatments $Self Care/Home Management : 8-22 mins  Pilar Grammes 04/05/2015, 11:54 AM

## 2015-04-06 DIAGNOSIS — N179 Acute kidney failure, unspecified: Secondary | ICD-10-CM

## 2015-04-06 LAB — BASIC METABOLIC PANEL
Anion gap: 4 — ABNORMAL LOW (ref 5–15)
BUN: 23 mg/dL — AB (ref 6–20)
CHLORIDE: 107 mmol/L (ref 101–111)
CO2: 25 mmol/L (ref 22–32)
CREATININE: 1.79 mg/dL — AB (ref 0.61–1.24)
Calcium: 7.8 mg/dL — ABNORMAL LOW (ref 8.9–10.3)
GFR calc Af Amer: 46 mL/min — ABNORMAL LOW (ref >60)
GFR calc non Af Amer: 40 mL/min — ABNORMAL LOW (ref >60)
GLUCOSE: 107 mg/dL — AB (ref 65–99)
Potassium: 4.2 mmol/L (ref 3.5–5.1)
Sodium: 136 mmol/L (ref 135–145)

## 2015-04-06 LAB — CBC
HEMATOCRIT: 27.1 % — AB (ref 39.0–52.0)
Hemoglobin: 9 g/dL — ABNORMAL LOW (ref 13.0–17.0)
MCH: 29.4 pg (ref 26.0–34.0)
MCHC: 33.2 g/dL (ref 30.0–36.0)
MCV: 88.6 fL (ref 78.0–100.0)
PLATELETS: 92 10*3/uL — AB (ref 150–400)
RBC: 3.06 MIL/uL — ABNORMAL LOW (ref 4.22–5.81)
RDW: 13.7 % (ref 11.5–15.5)
WBC: 8.4 10*3/uL (ref 4.0–10.5)

## 2015-04-06 MED ORDER — OXYCODONE-ACETAMINOPHEN 5-325 MG PO TABS: 1.0000 | ORAL_TABLET | ORAL | Status: DC | PRN

## 2015-04-06 MED ORDER — METHOCARBAMOL 500 MG PO TABS: 500.0000 mg | ORAL_TABLET | Freq: Four times a day (QID) | ORAL | Status: DC | PRN

## 2015-04-06 NOTE — Progress Notes (Signed)
Patient was seen and examined. Vital signs stable. Neuro: Nonfocal Cardiovascular: Heart sounds regular, no murmurs Lungs: Clear to auscultation  Labs: Creatinine now down trending to 1.79, hemoglobin stable at 9.0. Reticulocyte count stable in the 90s K  Impression: Acute renal failure-improving-likely prerenal azotemia in a setting of acute perioperative blood loss  Plan: Discussed with Dr. Malen GauzeElsner-agree with discharge. Avoid NSAIDs. Have asked patient to keep himself well-hydrated. Follow-up with PCP in the next one to two weeks for lab check.

## 2015-04-06 NOTE — Care Management Note (Signed)
Case Management Note  Patient Details  Name: Russ HaloCecil Gangi MRN: [id number] Date of Birth: 1955/09/25  Subjective/Objective:                    Action/Plan: Patient being discharged home today. CM spoke with the patient and his wife and they do not want home health PT at this time. Patient does want the 3 in 1 and the rolling walker that are recommended by PT. CM called Jermaine with [facility] and he is to deliver the equipment to the room. Patient and wife aware not to leave without the equipment. Bedside RN updated.   Expected Discharge Date:                  Expected Discharge Plan:  Home/Self Care  In-House Referral:     Discharge planning Services     Post Acute Care Choice:    Choice offered to:  Patient  DME Arranged:  3-N-1, Walker rolling DME Agency:  [facility]  HH Arranged:    HH Agency:     Status of Service:  Completed, signed off  Medicare Important Message Given:    Date Medicare IM Given:    Medicare IM give by:    Date Additional Medicare IM Given:    Additional Medicare Important Message give by:     If discussed at Long Length of Stay Meetings, dates discussed:    Additional Comments:  Kermit BaloKelli F Belina Mandile, RN 04/06/2015, 9:41 AM

## 2015-04-06 NOTE — Progress Notes (Signed)
Occupational Therapy Treatment Patient Details Name: Samaj Wessells MRN: [id number] DOB: Mar 02, 1956 Today's Date: 04/06/2015    History of present illness Pt is a 59 y/o male who presents s/p L3-L5 laminectomy with decompression of L3-S1 nerve roots bilaterally on 04/03/15.   OT comments  Patient reports he is going home today. He wants to make sure he gets 3 in 1 prior to discharge. Education regarding ADLs was focus of this session. Wife purchased long-handled AE and toilet aide for home.   Follow Up Recommendations  No OT follow up;Supervision - Intermittent    Equipment Recommendations  3 in 1 bedside comode    Recommendations for Other Services      Precautions / Restrictions Precautions Precautions: Fall;Back Precaution Comments: reviewed back precautions with patient and wife Required Braces or Orthoses: Spinal Brace Spinal Brace: Applied in sitting position       Mobility Bed Mobility                  Transfers                      Balance                                   ADL Overall ADL's : Needs assistance/impaired                                       General ADL Comments: Reviewed ADL education. Patient's wife reported that she purchased hip kit and toilet aide for LB self-care and toileting. Patient and wife had no questions regarding how to use equipment. Patient declined to practice toilet and shower transfers again; states he feels comfortable from practicing it yesterday. They would like 3 in 1 for home to use over toilet and in shower.      Vision                     Perception     Praxis      Cognition   Behavior During Therapy: WFL for tasks assessed/performed Overall Cognitive Status: Within Functional Limits for tasks assessed                       Extremity/Trunk Assessment               Exercises     Shoulder Instructions       General Comments       Pertinent Vitals/ Pain       Pain Assessment: 0-10 Pain Score:  ("not too bad" -- no number given by pt) Pain Location: back Pain Descriptors / Indicators: Aching Pain Intervention(s): Monitored during session;RN gave pain meds during session  Home Living                                          Prior Functioning/Environment              Frequency Min 2X/week     Progress Toward Goals  OT Goals(current goals can now be found in the care plan section)  Progress towards OT goals: Progressing toward goals  Acute Rehab OT Goals Patient Stated Goal: Home  Plan Discharge plan remains appropriate  Co-evaluation                 End of Session     Activity Tolerance     Patient Left     Nurse Communication          Time: 0852-0903 OT Time Calculation (min): 11 min  Charges: OT General Charges $OT Visit: 1 Procedure OT Treatments $Self Care/Home Management : 8-22 mins  Early, Leila A 04/06/2015, 9:09 AM    

## 2015-04-06 NOTE — Progress Notes (Signed)
Physical Therapy Treatment and Discharge Patient Details Name: Gregory Foley MRN: [id number] DOB: Apr 21, 1956 Today's Date: 04/06/2015    History of Present Illness Pt is a 59 y/o male who presents s/p L3-L5 laminectomy with decompression of L3-S1 nerve roots bilaterally on 04/03/15.    PT Comments    Pt demonstrating good functional mobility and overall safety with transfers, ambulation and stair negotiation this session. PT goals met and pt anticipating d/c home this afternoon. Reviewed general safety with pt and wife, and pt was able to state 3/3 back precautions and maintain well throughout session. PT signing off at this time. If needs change, please reconsult.    Follow Up Recommendations  Outpatient PT;Supervision for mobility/OOB (When appropriate per post-op protocol)     Equipment Recommendations  Rolling walker with 5" wheels;3in1 (PT)    Recommendations for Other Services       Precautions / Restrictions Precautions Precautions: Fall;Back Precaution Booklet Issued: Yes (comment) Precaution Comments: Pt able to recall 3/3 back precautions Required Braces or Orthoses: Spinal Brace Spinal Brace: Applied in sitting position Restrictions Weight Bearing Restrictions: No    Mobility  Bed Mobility               General bed mobility comments: Pt was in the recliner upon PT arrival.  Transfers Overall transfer level: Modified independent Equipment used: Rolling walker (2 wheeled) Transfers: Sit to/from Stand           General transfer comment: Pt demonstrated proper hand placement and safety awareness. No unsteadiness noted.   Ambulation/Gait Ambulation/Gait assistance: Modified independent (Device/Increase time) Ambulation Distance (Feet): 500 Feet Assistive device: Rolling walker (2 wheeled) Gait Pattern/deviations: Step-through pattern;Decreased stride length Gait velocity: Decreased Gait velocity interpretation: Below normal speed for  age/gender General Gait Details: Slow and guarded gait. No assist required. Pt with good management of RW and maintained precautions well.    Stairs Stairs: Yes Stairs assistance: Supervision Stair Management: One rail Right;Step to pattern;Forwards Number of Stairs: 10 General stair comments: Supervision for safety and VC's for sequencing. Pt steady and with no balance disturbances noted.   Wheelchair Mobility    Modified Rankin (Stroke Patients Only)       Balance Overall balance assessment: No apparent balance deficits (not formally assessed)                                  Cognition Arousal/Alertness: Awake/alert Behavior During Therapy: WFL for tasks assessed/performed Overall Cognitive Status: Within Functional Limits for tasks assessed                      Exercises      General Comments        Pertinent Vitals/Pain Pain Assessment: Faces Pain Score:  ("not too bad" -- no number given by pt) Faces Pain Scale: Hurts a little bit Pain Location:  (back) Pain Descriptors / Indicators: Operative site guarding Pain Intervention(s): Limited activity within patient's tolerance;Monitored during session;Repositioned    Home Living                      Prior Function            PT Goals (current goals can now be found in the care plan section) Acute Rehab PT Goals Patient Stated Goal: Home PT Goal Formulation: With patient/family Time For Goal Achievement: 04/11/15 Potential to Achieve Goals: Good Progress towards PT goals: Goals  met/education completed, patient discharged from PT    Frequency  Min 5X/week    PT Plan Current plan remains appropriate    Co-evaluation             End of Session Equipment Utilized During Treatment: Back brace Activity Tolerance: Patient tolerated treatment well Patient left: in chair;with call bell/phone within reach;with family/visitor present     Time: 5038-8828 PT Time  Calculation (min) (ACUTE ONLY): 16 min  Charges:  $Gait Training: 8-22 mins                    G Codes:      Gregory Foley 04/24/2015, 12:16 PM   Gregory Foley, PT, DPT Acute Rehabilitation Services Pager: [phone]

## 2015-04-06 NOTE — Progress Notes (Signed)
Patient ID: Gregory HaloCecil Shetterly, male   DOB: September 03, 1955, 59 y.o.   MRN: [id number] Vital signs are stable Incision on back is clean and dry Creatinine appears to be decreasing Hematocrit is stable Patient is ready for discharge today

## 2015-04-06 NOTE — Discharge Summary (Signed)
Physician Discharge Summary  Patient ID: Gregory HaloCecil Foley MRN: [id number] DOB/AGE: 01-May-1956 59 y.o.  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 Acute blood loss anemia. Acute kidney injury with creatinine of 1.9. Active Problems:   Lumbar stenosis with neurogenic claudication   Acute kidney injury (HCC)   Postoperative anemia due to acute blood loss   Discharged Condition: good  Hospital Course: Patient was admitted to undergo surgical decompression arthrodesis from L3 to the sacrum to revise a pseudoarthrosis at L3-4. He tolerated surgery well. Postoperatively he had anemia with a hematocrit of 27 secondary to acute blood loss. He however did not become unstable and required no transfusion. His creatinine rose to 1.9 after surgery however on the day of discharge it appeared to be improving. None [place] [facility] [place] orders were stopped and is advised against taking these. Is advised to maintain good hydration.  Consults: Hospitalist  Significant Diagnostic Studies: Postoperative electrolytes and BUN/creatinine with creatinine of 1.7 at time of discharge. Postoperative hematocrit of 27 at time of discharge.  Treatments: surgery: Decompression of L3-4 L4-5 and L5-S1 revision of pseudoarthrosis L3-4 segmental fixation L3 to sacrum with posterior lumbar interbody arthrodesis L3-4 L4-5 L5-S1, posterior lateral arthrodesis L3-S1.  Discharge Exam: Blood pressure 145/90, pulse 95, temperature 98.5 F (36.9 C), temperature source Oral, resp. rate 20, height 6' (1.829 m), weight 102.9 kg (226 lb 13.7 oz), SpO2 98 %. His incision is clean and dry, motor function is intact in lower extremities. Station and gait are intact.  Disposition: Discharge home  Discharge Instructions    Call MD for:   redness, tenderness, or signs of infection (pain, swelling, redness, odor or green/yellow discharge around incision site)    Complete by:  As directed      Call MD for:  severe uncontrolled pain    Complete by:  As directed      Call MD for:  temperature >100.4    Complete by:  As directed      Diet - low sodium heart healthy    Complete by:  As directed      Discharge instructions    Complete by:  As directed   Okay to shower. Do not apply salves or appointments to incision. No heavy lifting with the upper extremities greater than 15 pounds. May resume driving when not requiring pain medication and patient feels comfortable with doing so.     Increase activity slowly    Complete by:  As directed             Medication List    STOP taking these medications        ALEVE 220 MG tablet  Generic drug:  naproxen sodium      TAKE these medications        gabapentin 100 MG capsule  Commonly known as:  NEURONTIN  Take 1 capsule (100 mg total) by mouth 3 (three) times daily. TAKE 100 MG IN THE AM, 100 MG AFTERNOON AND 300 MG AT NIGHT     methocarbamol 500 MG tablet  Commonly known as:  ROBAXIN  Take 1 tablet (500 mg total) by mouth every 6 (six) hours as needed for muscle spasms.     oxyCODONE-acetaminophen 5-325 MG tablet  Commonly known as:  PERCOCET/ROXICET  Take 1-2 tablets by mouth every 4 (four) hours as needed for moderate pain.  SignedStefani Dama 04/06/2015, 8:49 AM

## 2015-04-11 MED FILL — Sodium Chloride IV Soln 0.9%: INTRAVENOUS | Qty: 1000 | Status: AC

## 2015-04-11 MED FILL — Heparin Sodium (Porcine) Inj 1000 Unit/ML: INTRAMUSCULAR | Qty: 30 | Status: AC

## 2015-04-14 ENCOUNTER — Encounter: Payer: Self-pay | Admitting: Physician Assistant

## 2015-04-14 ENCOUNTER — Ambulatory Visit (INDEPENDENT_AMBULATORY_CARE_PROVIDER_SITE_OTHER): Payer: Commercial Managed Care - HMO | Admitting: Physician Assistant

## 2015-04-14 VITALS — BP 138/85 | HR 98 | Temp 98.8°F | Resp 16 | Ht 71.0 in | Wt 226.0 lb

## 2015-04-14 DIAGNOSIS — R109 Unspecified abdominal pain: Secondary | ICD-10-CM | POA: Diagnosis not present

## 2015-04-14 MED ORDER — OXYCODONE-ACETAMINOPHEN 5-325 MG PO TABS: 1.0000 | ORAL_TABLET | ORAL | Status: DC | PRN

## 2015-04-14 NOTE — Progress Notes (Signed)
Pre visit review using our clinic review tool, if applicable. No additional management support is needed unless otherwise documented below in the visit note/SLS  

## 2015-04-14 NOTE — Progress Notes (Signed)
Patient presents to clinic today c/o R side pain x 2 days. Denies trauma or injury. Endorses pain is worse with motion (twisting and bending) and when laying on affected side. Denies chest pain or SOB.   Past Medical History  Diagnosis Date  . 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 Herniation of lumbar intervertebral disc     and Cervical Neck  . Arthritis     Back  . Kidney stone     Last 25 years ago  . Elevated blood pressure     pt states that he was treated for a short time for elevated BP but PCP didn't think he needed it    Current Outpatient Prescriptions on File Prior to Visit  Medication Sig Dispense Refill  . methocarbamol (ROBAXIN) 500 MG tablet Take 1 tablet (500 mg total) by mouth every 6 (six) hours as needed for muscle spasms. 60 tablet 3  . gabapentin (NEURONTIN) 100 MG capsule Take 1 capsule (100 mg total) by mouth 3 (three) times daily. TAKE 100 MG IN THE AM, 100 MG AFTERNOON AND 300 MG AT NIGHT (Patient not taking: Reported on 04/14/2015) 90 capsule 3   No current facility-administered medications on file prior to visit.    No Known Allergies  Family History  Problem Relation Age of Onset  . Arthritis Mother     Living  . Prostate cancer Father 64    Deceased  . Stroke Father   . Bladder Cancer Brother   . Alzheimer's disease Maternal Grandfather   . Heart disease Other     Maternal Aunts & Uncles  . Cancer Other     Paternal Aunts & Uncles  . Heart disease Sister     x1  . Healthy Brother     x3  . Psychiatric Illness Brother     x1    Social History   Social History  . Marital Status: Married    Spouse Name: N/A  . Number of Children: 0  . Years of Education: N/A   Social History Main Topics  . Smoking status: Never Smoker   . Smokeless tobacco: Never Used  . Alcohol Use: No  . Drug Use: No  . Sexual Activity: Not Asked   Other Topics Concern  . None   Social History Narrative    Review of Systems -  See HPI.  All other ROS are negative.  BP 138/85 mmHg  Pulse 98  Temp(Src) 98.8 F (37.1 C) (Oral)  Resp 16  Ht 5\' 11"  (1.803 m)  Wt 226 lb (102.513 kg)  BMI 31.53 kg/m2  SpO2 9%  Physical Exam  Constitutional: He is oriented to person, place, and time and well-developed, well-nourished, and in no distress.  HENT:  Head: Normocephalic and atraumatic.  Eyes: Conjunctivae are normal.  Neck: Neck supple.  Cardiovascular: Normal rate, regular rhythm, normal heart sounds and intact distal pulses.   Pulmonary/Chest: Effort normal and breath sounds normal. No respiratory distress. He has no wheezes. He has no rales.  R sided chest tenderness radiating around R side. No palpable bony abnormality. Pain worsened with ROM.  Neurological: He is alert and oriented to person, place, and time.  Skin: Skin is warm and dry. No rash noted.  Psychiatric: Affect normal.  Vitals reviewed.   Recent Results (from the past 2160 hour(s))  Surgical pcr screen     Status: Abnormal   Collection Time: 03/24/15  8:50 AM  Result Value Ref Range   MRSA, PCR NEGATIVE NEGATIVE   Staphylococcus aureus POSITIVE (A) NEGATIVE    Comment:        The Xpert SA Assay (FDA approved for NASAL specimens in patients over 14 years of age), is one component of a comprehensive surveillance program.  Test performance has been validated by [facility] for patients greater than or equal to 33 year old. It is not intended to diagnose infection nor to guide or monitor treatment.   CBC     Status: Abnormal   Collection Time: 03/24/15  8:51 AM  Result Value Ref Range   WBC 4.9 4.0 - 10.5 K/uL   RBC 5.44 4.22 - 5.81 MIL/uL   Hemoglobin 16.0 13.0 - 17.0 g/dL   HCT 47.0 39.0 - 52.0 %   MCV 86.4 78.0 - 100.0 fL   MCH 29.4 26.0 - 34.0 pg   MCHC 34.0 30.0 - 36.0 g/dL   RDW 13.2 11.5 - 15.5 %   Platelets 142 (L) 150 - 400 K/uL  Type and screen     Status: None   Collection Time: 03/24/15  9:01 AM  Result Value Ref  Range   ABO/RH(D) A NEG    Antibody Screen NEG    Sample Expiration 04/07/2015   ABO/Rh     Status: None   Collection Time: 03/24/15  9:01 AM  Result Value Ref Range   ABO/RH(D) A NEG   I-STAT 4, (NA,K, GLUC, HGB,HCT)     Status: Abnormal   Collection Time: 04/03/15  3:02 PM  Result Value Ref Range   Sodium 141 135 - 145 mmol/L   Potassium 4.4 3.5 - 5.1 mmol/L   Glucose, Bld 113 (H) 65 - 99 mg/dL   HCT 42.0 39.0 - 52.0 %   Hemoglobin 14.3 13.0 - 17.0 g/dL  I-STAT 4, (NA,K, GLUC, HGB,HCT)     Status: Abnormal   Collection Time: 04/03/15  5:37 PM  Result Value Ref Range   Sodium 139 135 - 145 mmol/L   Potassium 4.3 3.5 - 5.1 mmol/L   Glucose, Bld 138 (H) 65 - 99 mg/dL   HCT 34.0 (L) 39.0 - 52.0 %   Hemoglobin 11.6 (L) 13.0 - 17.0 g/dL  CBC     Status: Abnormal   Collection Time: 04/04/15  4:34 AM  Result Value Ref Range   WBC 15.1 (H) 4.0 - 10.5 K/uL   RBC 4.30 4.22 - 5.81 MIL/uL   Hemoglobin 12.7 (L) 13.0 - 17.0 g/dL   HCT 38.0 (L) 39.0 - 52.0 %   MCV 88.4 78.0 - 100.0 fL   MCH 29.5 26.0 - 34.0 pg   MCHC 33.4 30.0 - 36.0 g/dL   RDW 13.9 11.5 - 15.5 %   Platelets 120 (L) 150 - 400 K/uL  Basic Metabolic Panel     Status: Abnormal   Collection Time: 04/04/15  4:34 AM  Result Value Ref Range   Sodium 142 135 - 145 mmol/L   Potassium 4.7 3.5 - 5.1 mmol/L   Chloride 107 101 - 111 mmol/L   CO2 24 22 - 32 mmol/L   Glucose, Bld 164 (H) 65 - 99 mg/dL   BUN 25 (H) 6 - 20 mg/dL   Creatinine, Ser 1.84 (H) 0.61 - 1.24 mg/dL   Calcium 8.1 (L) 8.9 - 10.3 mg/dL   GFR calc non Af Amer 39 (L) >60 mL/min   GFR calc Af Amer 45 (L) >60 mL/min    Comment: (NOTE)  The eGFR has been calculated using the CKD EPI equation. This calculation has not been validated in all clinical situations. eGFR's persistently <60 mL/min signify possible Chronic Kidney Disease.    Anion gap 11 5 - 15  CBC     Status: Abnormal   Collection Time: 04/05/15  6:50 AM  Result Value Ref Range   WBC 10.9 (H) 4.0  - 10.5 K/uL   RBC 3.29 (L) 4.22 - 5.81 MIL/uL   Hemoglobin 9.7 (L) 13.0 - 17.0 g/dL    Comment: REPEATED TO VERIFY DELTA CHECK NOTED SPECIMEN CHECKED FOR CLOTS    HCT 28.4 (L) 39.0 - 52.0 %   MCV 86.3 78.0 - 100.0 fL   MCH 29.5 26.0 - 34.0 pg   MCHC 34.2 30.0 - 36.0 g/dL   RDW 13.9 11.5 - 15.5 %   Platelets 95 (L) 150 - 400 K/uL    Comment: PLATELET COUNT CONFIRMED BY SMEAR REPEATED TO VERIFY   Basic metabolic panel     Status: Abnormal   Collection Time: 04/05/15  6:50 AM  Result Value Ref Range   Sodium 140 135 - 145 mmol/L   Potassium 4.1 3.5 - 5.1 mmol/L   Chloride 107 101 - 111 mmol/L   CO2 25 22 - 32 mmol/L   Glucose, Bld 108 (H) 65 - 99 mg/dL   BUN 33 (H) 6 - 20 mg/dL   Creatinine, Ser 1.95 (H) 0.61 - 1.24 mg/dL   Calcium 8.0 (L) 8.9 - 10.3 mg/dL   GFR calc non Af Amer 36 (L) >60 mL/min   GFR calc Af Amer 42 (L) >60 mL/min    Comment: (NOTE) The eGFR has been calculated using the CKD EPI equation. This calculation has not been validated in all clinical situations. eGFR's persistently <60 mL/min signify possible Chronic Kidney Disease.    Anion gap 8 5 - 15  Save smear     Status: None   Collection Time: 04/05/15  2:39 PM  Result Value Ref Range   Smear Review SMEAR STAINED AND AVAILABLE FOR REVIEW   Creatinine, urine, random     Status: None   Collection Time: 04/05/15  5:16 PM  Result Value Ref Range   Creatinine, Urine 122.74 mg/dL  Sodium, urine, random     Status: None   Collection Time: 04/05/15  5:16 PM  Result Value Ref Range   Sodium, Ur 42 mmol/L  Basic metabolic panel     Status: Abnormal   Collection Time: 04/06/15  5:35 AM  Result Value Ref Range   Sodium 136 135 - 145 mmol/L   Potassium 4.2 3.5 - 5.1 mmol/L   Chloride 107 101 - 111 mmol/L   CO2 25 22 - 32 mmol/L   Glucose, Bld 107 (H) 65 - 99 mg/dL   BUN 23 (H) 6 - 20 mg/dL   Creatinine, Ser 1.79 (H) 0.61 - 1.24 mg/dL   Calcium 7.8 (L) 8.9 - 10.3 mg/dL   GFR calc non Af Amer 40 (L) >60  mL/min   GFR calc Af Amer 46 (L) >60 mL/min    Comment: (NOTE) The eGFR has been calculated using the CKD EPI equation. This calculation has not been validated in all clinical situations. eGFR's persistently <60 mL/min signify possible Chronic Kidney Disease.    Anion gap 4 (L) 5 - 15  CBC     Status: Abnormal   Collection Time: 04/06/15  5:35 AM  Result Value Ref Range   WBC 8.4 4.0 - 10.5  K/uL   RBC 3.06 (L) 4.22 - 5.81 MIL/uL   Hemoglobin 9.0 (L) 13.0 - 17.0 g/dL   HCT 27.1 (L) 39.0 - 52.0 %   MCV 88.6 78.0 - 100.0 fL   MCH 29.4 26.0 - 34.0 pg   MCHC 33.2 30.0 - 36.0 g/dL   RDW 13.7 11.5 - 15.5 %   Platelets 92 (L) 150 - 400 K/uL    Comment: CONSISTENT WITH PREVIOUS RESULT    Assessment/Plan: Side pain MSK in nature. Pain medication refilled. Take as directed. Topical Salon Pas to the area. Heating pad. Limit heavy lifting or overexertion.

## 2015-04-14 NOTE — Patient Instructions (Signed)
Please continue pain medication as directed, weaning down as you feel better. Apply Salon Pas or Aspercreme to the affected area. Use heating pad as directed. Avoid heavy lifting or using that right arm to pull yourself into bed (try sleeping on other side of bed if possible)  Follow-up if symptoms are not resolving.

## 2015-04-15 DIAGNOSIS — R109 Unspecified abdominal pain: Secondary | ICD-10-CM | POA: Insufficient documentation

## 2015-04-15 NOTE — Assessment & Plan Note (Signed)
MSK in nature. Pain medication refilled. Take as directed. Topical Salon Pas to the area. Heating pad. Limit heavy lifting or overexertion.

## 2015-05-15 ENCOUNTER — Encounter: Payer: Self-pay | Admitting: Physician Assistant

## 2015-05-15 ENCOUNTER — Ambulatory Visit (INDEPENDENT_AMBULATORY_CARE_PROVIDER_SITE_OTHER): Payer: Commercial Managed Care - HMO | Admitting: Physician Assistant

## 2015-05-15 VITALS — BP 154/94 | HR 89 | Temp 98.1°F | Ht 72.0 in | Wt 225.2 lb

## 2015-05-15 DIAGNOSIS — K4030 Unilateral inguinal hernia, with obstruction, without gangrene, not specified as recurrent: Secondary | ICD-10-CM | POA: Diagnosis not present

## 2015-05-15 DIAGNOSIS — Z23 Encounter for immunization: Secondary | ICD-10-CM

## 2015-05-15 MED ORDER — OXYCODONE-ACETAMINOPHEN 5-325 MG PO TABS: 1.0000 | ORAL_TABLET | ORAL | Status: DC | PRN

## 2015-05-15 NOTE — Progress Notes (Signed)
Patient with history of multiple inguinal hernias s/p repair presents to clinic today c/o left inguinal pain associated with a bulging mass that has been present intermittently over the past year but has been painful almost daily over the past few months. Pain is 9/10 when present and lasts 1-2 hours. Patient denies difficulty with bowel movements or urination.  Past Medical History  Diagnosis Date  . 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 Herniation of lumbar intervertebral disc     and Cervical Neck  . Arthritis     Back  . Kidney stone     Last 25 years ago  . Elevated blood pressure     pt states that he was treated for a short time for elevated BP but PCP didn't think he needed it    Current Outpatient Prescriptions on File Prior to Visit  Medication Sig Dispense Refill  . gabapentin (NEURONTIN) 100 MG capsule Take 1 capsule (100 mg total) by mouth 3 (three) times daily. TAKE 100 MG IN THE AM, 100 MG AFTERNOON AND 300 MG AT NIGHT 90 capsule 3  . methocarbamol (ROBAXIN) 500 MG tablet Take 1 tablet (500 mg total) by mouth every 6 (six) hours as needed for muscle spasms. 60 tablet 3   No current facility-administered medications on file prior to visit.    No Known Allergies  Family History  Problem Relation Age of Onset  . Arthritis Mother     Living  . Prostate cancer Father 23    Deceased  . Stroke Father   . Bladder Cancer Brother   . Alzheimer's disease Maternal Grandfather   . Heart disease Other     Maternal Aunts & Uncles  . Cancer Other     Paternal Aunts & Uncles  . Heart disease Sister     x1  . Healthy Brother     x3  . Psychiatric Illness Brother     x1    Social History   Social History  . Marital Status: Married    Spouse Name: N/A  . Number of Children: 0  . Years of Education: N/A   Social History Main Topics  . Smoking status: Never Smoker   . Smokeless tobacco: Never Used  . Alcohol Use: No  . Drug Use: No  .  Sexual Activity: Not Asked   Other Topics Concern  . None   Social History Narrative    Review of Systems - See HPI.  All other ROS are negative.  BP 154/94 mmHg  Pulse 89  Temp(Src) 98.1 F (36.7 C) (Oral)  Ht 6' (1.829 m)  Wt 225 lb 4 oz (102.173 kg)  BMI 30.54 kg/m2  SpO2 99%  Physical Exam  Constitutional: He is oriented to person, place, and time and well-developed, well-nourished, and in no distress.  HENT:  Head: Normocephalic and atraumatic.  Cardiovascular: Normal rate, regular rhythm, normal heart sounds and intact distal pulses.   Pulmonary/Chest: Effort normal and breath sounds normal.  Abdominal: Soft. Bowel sounds are normal. A hernia is present. Hernia confirmed positive in the left inguinal area.  Neurological: He is alert and oriented to person, place, and time.  Skin: Skin is warm and dry. No rash noted.  Psychiatric: Affect normal.  Vitals reviewed.   Recent Results (from the past 2160 hour(s))  Surgical pcr screen     Status: Abnormal   Collection Time: 03/24/15  8:50 AM  Result Value Ref  Range   MRSA, PCR NEGATIVE NEGATIVE   Staphylococcus aureus POSITIVE (A) NEGATIVE    Comment:        The Xpert SA Assay (FDA approved for NASAL specimens in patients over 39 years of age), is one component of a comprehensive surveillance program.  Test performance has been validated by [facility] for patients greater than or equal to 43 year old. It is not intended to diagnose infection nor to guide or monitor treatment.   CBC     Status: Abnormal   Collection Time: 03/24/15  8:51 AM  Result Value Ref Range   WBC 4.9 4.0 - 10.5 K/uL   RBC 5.44 4.22 - 5.81 MIL/uL   Hemoglobin 16.0 13.0 - 17.0 g/dL   HCT 47.0 39.0 - 52.0 %   MCV 86.4 78.0 - 100.0 fL   MCH 29.4 26.0 - 34.0 pg   MCHC 34.0 30.0 - 36.0 g/dL   RDW 13.2 11.5 - 15.5 %   Platelets 142 (L) 150 - 400 K/uL  Type and screen     Status: None   Collection Time: 03/24/15  9:01 AM  Result Value Ref  Range   ABO/RH(D) A NEG    Antibody Screen NEG    Sample Expiration 04/07/2015   ABO/Rh     Status: None   Collection Time: 03/24/15  9:01 AM  Result Value Ref Range   ABO/RH(D) A NEG   I-STAT 4, (NA,K, GLUC, HGB,HCT)     Status: Abnormal   Collection Time: 04/03/15  3:02 PM  Result Value Ref Range   Sodium 141 135 - 145 mmol/L   Potassium 4.4 3.5 - 5.1 mmol/L   Glucose, Bld 113 (H) 65 - 99 mg/dL   HCT 42.0 39.0 - 52.0 %   Hemoglobin 14.3 13.0 - 17.0 g/dL  I-STAT 4, (NA,K, GLUC, HGB,HCT)     Status: Abnormal   Collection Time: 04/03/15  5:37 PM  Result Value Ref Range   Sodium 139 135 - 145 mmol/L   Potassium 4.3 3.5 - 5.1 mmol/L   Glucose, Bld 138 (H) 65 - 99 mg/dL   HCT 34.0 (L) 39.0 - 52.0 %   Hemoglobin 11.6 (L) 13.0 - 17.0 g/dL  CBC     Status: Abnormal   Collection Time: 04/04/15  4:34 AM  Result Value Ref Range   WBC 15.1 (H) 4.0 - 10.5 K/uL   RBC 4.30 4.22 - 5.81 MIL/uL   Hemoglobin 12.7 (L) 13.0 - 17.0 g/dL   HCT 38.0 (L) 39.0 - 52.0 %   MCV 88.4 78.0 - 100.0 fL   MCH 29.5 26.0 - 34.0 pg   MCHC 33.4 30.0 - 36.0 g/dL   RDW 13.9 11.5 - 15.5 %   Platelets 120 (L) 150 - 400 K/uL  Basic Metabolic Panel     Status: Abnormal   Collection Time: 04/04/15  4:34 AM  Result Value Ref Range   Sodium 142 135 - 145 mmol/L   Potassium 4.7 3.5 - 5.1 mmol/L   Chloride 107 101 - 111 mmol/L   CO2 24 22 - 32 mmol/L   Glucose, Bld 164 (H) 65 - 99 mg/dL   BUN 25 (H) 6 - 20 mg/dL   Creatinine, Ser 1.84 (H) 0.61 - 1.24 mg/dL   Calcium 8.1 (L) 8.9 - 10.3 mg/dL   GFR calc non Af Amer 39 (L) >60 mL/min   GFR calc Af Amer 45 (L) >60 mL/min    Comment: (NOTE) The eGFR has  been calculated using the CKD EPI equation. This calculation has not been validated in all clinical situations. eGFR's persistently <60 mL/min signify possible Chronic Kidney Disease.    Anion gap 11 5 - 15  CBC     Status: Abnormal   Collection Time: 04/05/15  6:50 AM  Result Value Ref Range   WBC 10.9 (H) 4.0  - 10.5 K/uL   RBC 3.29 (L) 4.22 - 5.81 MIL/uL   Hemoglobin 9.7 (L) 13.0 - 17.0 g/dL    Comment: REPEATED TO VERIFY DELTA CHECK NOTED SPECIMEN CHECKED FOR CLOTS    HCT 28.4 (L) 39.0 - 52.0 %   MCV 86.3 78.0 - 100.0 fL   MCH 29.5 26.0 - 34.0 pg   MCHC 34.2 30.0 - 36.0 g/dL   RDW 13.9 11.5 - 15.5 %   Platelets 95 (L) 150 - 400 K/uL    Comment: PLATELET COUNT CONFIRMED BY SMEAR REPEATED TO VERIFY   Basic metabolic panel     Status: Abnormal   Collection Time: 04/05/15  6:50 AM  Result Value Ref Range   Sodium 140 135 - 145 mmol/L   Potassium 4.1 3.5 - 5.1 mmol/L   Chloride 107 101 - 111 mmol/L   CO2 25 22 - 32 mmol/L   Glucose, Bld 108 (H) 65 - 99 mg/dL   BUN 33 (H) 6 - 20 mg/dL   Creatinine, Ser 1.95 (H) 0.61 - 1.24 mg/dL   Calcium 8.0 (L) 8.9 - 10.3 mg/dL   GFR calc non Af Amer 36 (L) >60 mL/min   GFR calc Af Amer 42 (L) >60 mL/min    Comment: (NOTE) The eGFR has been calculated using the CKD EPI equation. This calculation has not been validated in all clinical situations. eGFR's persistently <60 mL/min signify possible Chronic Kidney Disease.    Anion gap 8 5 - 15  Save smear     Status: None   Collection Time: 04/05/15  2:39 PM  Result Value Ref Range   Smear Review SMEAR STAINED AND AVAILABLE FOR REVIEW   Creatinine, urine, random     Status: None   Collection Time: 04/05/15  5:16 PM  Result Value Ref Range   Creatinine, Urine 122.74 mg/dL  Sodium, urine, random     Status: None   Collection Time: 04/05/15  5:16 PM  Result Value Ref Range   Sodium, Ur 42 mmol/L  Basic metabolic panel     Status: Abnormal   Collection Time: 04/06/15  5:35 AM  Result Value Ref Range   Sodium 136 135 - 145 mmol/L   Potassium 4.2 3.5 - 5.1 mmol/L   Chloride 107 101 - 111 mmol/L   CO2 25 22 - 32 mmol/L   Glucose, Bld 107 (H) 65 - 99 mg/dL   BUN 23 (H) 6 - 20 mg/dL   Creatinine, Ser 1.79 (H) 0.61 - 1.24 mg/dL   Calcium 7.8 (L) 8.9 - 10.3 mg/dL   GFR calc non Af Amer 40 (L) >60  mL/min   GFR calc Af Amer 46 (L) >60 mL/min    Comment: (NOTE) The eGFR has been calculated using the CKD EPI equation. This calculation has not been validated in all clinical situations. eGFR's persistently <60 mL/min signify possible Chronic Kidney Disease.    Anion gap 4 (L) 5 - 15  CBC     Status: Abnormal   Collection Time: 04/06/15  5:35 AM  Result Value Ref Range   WBC 8.4 4.0 - 10.5 K/uL  RBC 3.06 (L) 4.22 - 5.81 MIL/uL   Hemoglobin 9.0 (L) 13.0 - 17.0 g/dL   HCT 27.1 (L) 39.0 - 52.0 %   MCV 88.6 78.0 - 100.0 fL   MCH 29.4 26.0 - 34.0 pg   MCHC 33.2 30.0 - 36.0 g/dL   RDW 13.7 11.5 - 15.5 %   Platelets 92 (L) 150 - 400 K/uL    Comment: CONSISTENT WITH PREVIOUS RESULT    Assessment/Plan: Incarcerated left inguinal hernia Not strangulated. Direct inguinal hernia. Pain medication refilled. Supportive measures reviewed. CT Ordered. Referral to General Surgery placed. Alarm signs/symptoms reviewed with patient. ER if any occur.

## 2015-05-15 NOTE — Progress Notes (Signed)
Pre visit review using our clinic review tool, if applicable. No additional management support is needed unless otherwise documented below in the visit note. 

## 2015-05-15 NOTE — Assessment & Plan Note (Signed)
Not strangulated. Direct inguinal hernia. Pain medication refilled. Supportive measures reviewed. CT Ordered. Referral to General Surgery placed. Alarm signs/symptoms reviewed with patient. ER if any occur.

## 2015-05-15 NOTE — Patient Instructions (Addendum)
Please avoid heavy lifting or overexertion. Continue pain medication as directed. Wear your weight belt. You will be contacted for your CT scan and for assessment/treatment by General Surgery.  If anything acutely worsens, please go to the ER.

## 2015-05-24 ENCOUNTER — Ambulatory Visit (HOSPITAL_BASED_OUTPATIENT_CLINIC_OR_DEPARTMENT_OTHER)
Admission: RE | Admit: 2015-05-24 | Discharge: 2015-05-24 | Disposition: A | Discharge status: Home / Self Care | Payer: Commercial Managed Care - HMO | Source: Ambulatory Visit | Attending: Physician Assistant | Admitting: Physician Assistant

## 2015-05-24 DIAGNOSIS — R911 Solitary pulmonary nodule: Secondary | ICD-10-CM | POA: Diagnosis not present

## 2015-05-24 DIAGNOSIS — K4030 Unilateral inguinal hernia, with obstruction, without gangrene, not specified as recurrent: Secondary | ICD-10-CM

## 2015-05-24 DIAGNOSIS — R918 Other nonspecific abnormal finding of lung field: Secondary | ICD-10-CM | POA: Diagnosis not present

## 2015-05-26 ENCOUNTER — Encounter: Payer: Self-pay | Admitting: Physician Assistant

## 2015-05-30 ENCOUNTER — Telehealth: Payer: Self-pay | Admitting: Physician Assistant

## 2015-05-30 NOTE — Telephone Encounter (Signed)
Called patient to schedule Flu Shot but patient had his done here on 05/15/2015

## 2015-05-30 NOTE — Telephone Encounter (Signed)
Already noted in HM.

## 2015-06-01 ENCOUNTER — Ambulatory Visit: Payer: Self-pay | Admitting: General Surgery

## 2015-06-01 NOTE — H&P (Signed)
History of Present Illness Gregory Foley(Gregory Sharrar Foley; 06/01/2015 10:16 AM) The patient is a 59 year old male who presents with an inguinal hernia. The patient is a 59 year old male who is referred by Gregory MatesWilliam Martin, PA-C for evaluation of recurrent left inguinal hernia. The patient states he's had bilateral recurrent hernias all fixed open in the past. He states that his left inguinal hernia was most recently fixed in open fashion approximately 15 years ago. He states this is subsequently recurred. He is unsure of the timing. He does state that he has some discomfort pain area. Patient also appears to have had chronic back pain recently underwent surgery for spinal repair.  Patient is had no signs and symptoms of strangulation.   Other Problems Gregory Foley(Gregory Foley, Foley; 06/01/2015 10:06 AM) Arthritis Back Pain High blood pressure Inguinal Hernia Kidney Stone  Past Surgical History Gregory Foley(Gregory Foley, Foley; 06/01/2015 10:06 AM) Foot Surgery Bilateral. Open Inguinal Hernia Surgery Bilateral. multiple Spinal Surgery - Lower Back Spinal Surgery - Neck  Diagnostic Studies History Gregory Foley(Gregory Foley, Foley; 06/01/2015 10:06 AM) Colonoscopy 5-10 years ago  Allergies Gregory Foley(Gregory Foley, Foley; 06/01/2015 10:06 AM) No Known Drug Allergies12/01/2015  Medication History Gregory Foley(Gregory Foley, Foley; 06/01/2015 10:06 AM) Gabapentin (100MG  Capsule, Oral) Active. Methocarbamol (500MG  Tablet, Oral) Active. Oxycodone-Acetaminophen (5-325MG  Tablet, Oral) Active. Medications Reconciled  Social History Gregory Foley([place]; 06/01/2015 10:06 AM) Caffeine use Carbonated beverages. No alcohol use No drug use Tobacco use Never smoker.  Family History Gregory Foley(Greg[place]; 06/01/2015 10:06 AM) Arthritis Mother. Cancer Brother. Cerebrovascular Accident Father. Prostate Cancer Father.    Review of Systems Gregory Foley(Gregory Laughman Foley; 06/01/2015 10:15 AM) General Not Present- Appetite Loss, Chills, Fatigue, Fever, Night Sweats, Weight  Gain and Weight Loss. Skin Not Present- Change in Wart/Mole, Dryness, Hives, Jaundice, New Lesions, Non-Healing Wounds, Rash and Ulcer. HEENT Not Present- Earache, Hearing Loss, Hoarseness, Nose Bleed, Oral Ulcers, Ringing in the Ears, Seasonal Allergies, Sinus Pain, Sore Throat, Visual Disturbances, Wears glasses/contact lenses and Yellow Eyes. Respiratory Present- Snoring. Not Present- Bloody sputum, Chronic Cough, Difficulty Breathing and Wheezing. Breast Not Present- Breast Mass, Breast Pain, Nipple Discharge and Skin Changes. Cardiovascular Not Present- Chest Pain, Difficulty Breathing Lying Down, Leg Cramps, Palpitations, Rapid Heart Rate, Shortness of Breath and Swelling of Extremities. Gastrointestinal Not Present- Abdominal Pain, Bloating, Bloody Stool, Change in Bowel Habits, Chronic diarrhea, Constipation, Difficulty Swallowing, Excessive gas, Gets full quickly at meals, Hemorrhoids, Indigestion, Nausea, Rectal Pain and Vomiting. Male Genitourinary Not Present- Blood in Urine, Change in Urinary Stream, Frequency, Impotence, Nocturia, Painful Urination, Urgency and Urine Leakage. Musculoskeletal Not Present- Myalgia. Neurological Not Present- Weakness.  Vitals Gregory Foley(Gregory Foley; 06/01/2015 10:07 AM) 06/01/2015 10:07 AM Weight: 225 lb Height: 72in Body Surface Area: 2.24 m Body Mass Index: 30.52 kg/m  Temp.: 98.80F(Temporal)  Pulse: 80 (Regular)  BP: 130/70 (Sitting, Left Arm, Standard)       Physical Exam Gregory Foley(Gregory Foley; 06/01/2015 10:16 AM) General Mental Status-Alert. General Appearance-Consistent with stated age. Hydration-Well hydrated. Voice-Normal.  Head and Neck Head-normocephalic, atraumatic with no lesions or palpable masses. Trachea-midline.  Eye Eyeball - Bilateral-Extraocular movements intact. Sclera/Conjunctiva - Bilateral-No scleral icterus.  Chest and Lung Exam Chest and lung exam reveals -quiet, even and easy  respiratory effort with no use of accessory muscles. Inspection Chest Wall - Normal. Back - normal.  Cardiovascular Cardiovascular examination reveals -normal heart sounds, regular rate and rhythm with no murmurs.  Abdomen Inspection Skin - Scar - no surgical scars. Hernias - Inguinal hernia - Left - Incarcerated(Previous left inguinal  incision, open repair noted). Palpation/Percussion Normal exam - Soft, Non Tender, No Rebound tenderness, No Rigidity (guarding) and No hepatosplenomegaly. Auscultation Normal exam - Bowel sounds normal.  Neurologic Neurologic evaluation reveals -alert and oriented x 3 with no impairment of recent or remote memory. Mental Status-Normal.  Musculoskeletal Normal Exam - Left-Upper Extremity Strength Normal and Lower Extremity Strength Normal. Normal Exam - Right-Upper Extremity Strength Normal, Lower Extremity Weakness.    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 The patient will like to proceed to the operating room for laparoscopic left inguinal hernia repair.  2. I discussed with the patient the signs and symptoms of incarceration and strangulation and the need to proceed to the ER should they occur.  3. I discussed with the patient the risks and benefits of the procedure to include but not limited to: Infection, bleeding, damage to surrounding structures, possible need for further surgery, possible nerve pain, and possible recurrence. Also discussed him there is a possibility this could require conversion from laparoscopic to open surgery based on the anatomy and the mesh from previous operations. The patient voiced understanding and wishes to proceed.

## 2015-06-06 ENCOUNTER — Other Ambulatory Visit: Payer: Self-pay | Admitting: Physician Assistant

## 2015-06-06 MED ORDER — GABAPENTIN 100 MG PO CAPS: 100.0000 mg | ORAL_CAPSULE | Freq: Three times a day (TID) | ORAL | Status: DC

## 2015-06-09 ENCOUNTER — Encounter (HOSPITAL_COMMUNITY): Payer: Self-pay

## 2015-06-09 ENCOUNTER — Encounter (HOSPITAL_COMMUNITY)
Admission: RE | Admit: 2015-06-09 | Discharge: 2015-06-09 | Disposition: A | Discharge status: Home / Self Care | Payer: Commercial Managed Care - HMO | Source: Ambulatory Visit | Attending: General Surgery | Admitting: General Surgery

## 2015-06-09 DIAGNOSIS — K4091 Unilateral inguinal hernia, without obstruction or gangrene, recurrent: Secondary | ICD-10-CM | POA: Diagnosis not present

## 2015-06-09 LAB — BASIC METABOLIC PANEL WITH GFR
Anion gap: 5 (ref 5–15)
BUN: 12 mg/dL (ref 6–20)
CO2: 27 mmol/L (ref 22–32)
Calcium: 9.4 mg/dL (ref 8.9–10.3)
Chloride: 107 mmol/L (ref 101–111)
Creatinine, Ser: 1.15 mg/dL (ref 0.61–1.24)
GFR calc Af Amer: >60 mL/min
GFR calc non Af Amer: >60 mL/min
Glucose, Bld: 95 mg/dL (ref 65–99)
Potassium: 4.2 mmol/L (ref 3.5–5.1)
Sodium: 139 mmol/L (ref 135–145)

## 2015-06-09 LAB — CBC
HCT: 42.5 % (ref 39.0–52.0)
Hemoglobin: 13.8 g/dL (ref 13.0–17.0)
MCH: 27 pg (ref 26.0–34.0)
MCHC: 32.5 g/dL (ref 30.0–36.0)
MCV: 83 fL (ref 78.0–100.0)
Platelets: 167 10*3/uL (ref 150–400)
RBC: 5.12 MIL/uL (ref 4.22–5.81)
RDW: 13.9 % (ref 11.5–15.5)
WBC: 5.9 10*3/uL (ref 4.0–10.5)

## 2015-06-10 ENCOUNTER — Encounter (HOSPITAL_COMMUNITY): Payer: Self-pay | Admitting: Anesthesiology

## 2015-06-11 MED ORDER — CHLORHEXIDINE GLUCONATE 4 % EX LIQD: 1.0000 "application " | Freq: Once | CUTANEOUS | Status: DC

## 2015-06-11 MED ORDER — CEFAZOLIN SODIUM-DEXTROSE 2-3 GM-% IV SOLR
2.0000 g | INTRAVENOUS | Status: AC
  Administered 2015-06-12: 2 g via INTRAVENOUS
  Filled 2015-06-11: qty 50

## 2015-06-12 ENCOUNTER — Encounter (HOSPITAL_COMMUNITY): Payer: Self-pay | Admitting: Anesthesiology

## 2015-06-12 ENCOUNTER — Encounter (HOSPITAL_COMMUNITY)
Admission: RE | Disposition: A | Discharge status: Home / Self Care | Payer: Self-pay | Source: Ambulatory Visit | Attending: General Surgery

## 2015-06-12 ENCOUNTER — Ambulatory Visit (HOSPITAL_COMMUNITY)
Admission: RE | Admit: 2015-06-12 | Discharge: 2015-06-12 | Disposition: A | Discharge status: Home / Self Care | Payer: Commercial Managed Care - HMO | Source: Ambulatory Visit | Attending: General Surgery | Admitting: General Surgery

## 2015-06-12 ENCOUNTER — Ambulatory Visit (HOSPITAL_COMMUNITY): Payer: Commercial Managed Care - HMO | Admitting: Anesthesiology

## 2015-06-12 DIAGNOSIS — K4091 Unilateral inguinal hernia, without obstruction or gangrene, recurrent: Secondary | ICD-10-CM | POA: Insufficient documentation

## 2015-06-12 HISTORY — PX: INGUINAL HERNIA REPAIR: SHX194

## 2015-06-12 HISTORY — PX: INSERTION OF MESH: SHX5868

## 2015-06-12 SURGERY — REPAIR, HERNIA, INGUINAL, LAPAROSCOPIC
Anesthesia: General | Site: Groin | Laterality: Left

## 2015-06-12 MED ORDER — SUGAMMADEX SODIUM 500 MG/5ML IV SOLN
INTRAVENOUS | Status: DC | PRN
  Administered 2015-06-12: 205 mg via INTRAVENOUS

## 2015-06-12 MED ORDER — SODIUM CHLORIDE 0.9 % IJ SOLN: 3.0000 mL | INTRAMUSCULAR | Status: DC | PRN

## 2015-06-12 MED ORDER — OXYCODONE HCL 5 MG PO TABS
5.0000 mg | ORAL_TABLET | ORAL | Status: DC | PRN
  Administered 2015-06-12: 10 mg via ORAL

## 2015-06-12 MED ORDER — MIDAZOLAM HCL 2 MG/2ML IJ SOLN
INTRAMUSCULAR | Status: AC
  Filled 2015-06-12: qty 2

## 2015-06-12 MED ORDER — LIDOCAINE HCL (CARDIAC) 20 MG/ML IV SOLN
INTRAVENOUS | Status: DC | PRN
  Administered 2015-06-12: 50 mg via INTRAVENOUS

## 2015-06-12 MED ORDER — ROCURONIUM BROMIDE 100 MG/10ML IV SOLN
INTRAVENOUS | Status: DC | PRN
  Administered 2015-06-12: 10 mg via INTRAVENOUS
  Administered 2015-06-12: 50 mg via INTRAVENOUS

## 2015-06-12 MED ORDER — LACTATED RINGERS IV SOLN
INTRAVENOUS | Status: DC
  Administered 2015-06-12: 07:00:00 via INTRAVENOUS

## 2015-06-12 MED ORDER — LIDOCAINE HCL (CARDIAC) 20 MG/ML IV SOLN
INTRAVENOUS | Status: AC
  Filled 2015-06-12: qty 5

## 2015-06-12 MED ORDER — PROPOFOL 10 MG/ML IV BOLUS
INTRAVENOUS | Status: DC | PRN
  Administered 2015-06-12: 200 mg via INTRAVENOUS

## 2015-06-12 MED ORDER — 0.9 % SODIUM CHLORIDE (POUR BTL) OPTIME
TOPICAL | Status: DC | PRN
  Administered 2015-06-12: 1000 mL

## 2015-06-12 MED ORDER — GLYCOPYRROLATE 0.2 MG/ML IJ SOLN
INTRAMUSCULAR | Status: AC
  Filled 2015-06-12: qty 1

## 2015-06-12 MED ORDER — MORPHINE SULFATE (PF) 2 MG/ML IV SOLN
INTRAVENOUS | Status: AC
  Filled 2015-06-12: qty 1

## 2015-06-12 MED ORDER — OXYCODONE HCL 5 MG PO TABS
ORAL_TABLET | ORAL | Status: AC
  Administered 2015-06-12: 10 mg via ORAL
  Filled 2015-06-12: qty 1

## 2015-06-12 MED ORDER — BUPIVACAINE HCL 0.25 % IJ SOLN
INTRAMUSCULAR | Status: DC | PRN
  Administered 2015-06-12: 30 mL

## 2015-06-12 MED ORDER — SODIUM CHLORIDE 0.9 % IV SOLN: 250.0000 mL | INTRAVENOUS | Status: DC | PRN

## 2015-06-12 MED ORDER — OXYCODONE-ACETAMINOPHEN 5-325 MG PO TABS: 1.0000 | ORAL_TABLET | ORAL | Status: DC | PRN

## 2015-06-12 MED ORDER — ACETAMINOPHEN 650 MG RE SUPP: 650.0000 mg | RECTAL | Status: DC | PRN

## 2015-06-12 MED ORDER — MIDAZOLAM HCL 5 MG/5ML IJ SOLN
INTRAMUSCULAR | Status: DC | PRN
  Administered 2015-06-12: 2 mg via INTRAVENOUS

## 2015-06-12 MED ORDER — ROCURONIUM BROMIDE 50 MG/5ML IV SOLN
INTRAVENOUS | Status: AC
  Filled 2015-06-12: qty 2

## 2015-06-12 MED ORDER — FENTANYL CITRATE (PF) 250 MCG/5ML IJ SOLN
INTRAMUSCULAR | Status: AC
  Filled 2015-06-12: qty 5

## 2015-06-12 MED ORDER — ACETAMINOPHEN 325 MG PO TABS
650.0000 mg | ORAL_TABLET | ORAL | Status: DC | PRN
Start: 2015-06-12 — End: 2015-06-12
  Administered 2015-06-12: 650 mg via ORAL

## 2015-06-12 MED ORDER — MORPHINE SULFATE (PF) 2 MG/ML IV SOLN
2.0000 mg | INTRAVENOUS | Status: DC | PRN
  Administered 2015-06-12: 2 mg via INTRAVENOUS

## 2015-06-12 MED ORDER — LACTATED RINGERS IV SOLN
INTRAVENOUS | Status: DC | PRN
  Administered 2015-06-12: 09:00:00 via INTRAVENOUS

## 2015-06-12 MED ORDER — ONDANSETRON HCL 4 MG/2ML IJ SOLN: 4.0000 mg | Freq: Once | INTRAMUSCULAR | Status: DC | PRN

## 2015-06-12 MED ORDER — SODIUM CHLORIDE 0.9 % IJ SOLN: 3.0000 mL | Freq: Two times a day (BID) | INTRAMUSCULAR | Status: DC

## 2015-06-12 MED ORDER — BUPIVACAINE HCL (PF) 0.25 % IJ SOLN
INTRAMUSCULAR | Status: AC
  Filled 2015-06-12: qty 30

## 2015-06-12 MED ORDER — HYDROMORPHONE HCL 1 MG/ML IJ SOLN
INTRAMUSCULAR | Status: AC
  Administered 2015-06-12: 0.5 mg via INTRAVENOUS
  Filled 2015-06-12: qty 1

## 2015-06-12 MED ORDER — FENTANYL CITRATE (PF) 100 MCG/2ML IJ SOLN
INTRAMUSCULAR | Status: DC | PRN
  Administered 2015-06-12: 150 ug via INTRAVENOUS
  Administered 2015-06-12: 100 ug via INTRAVENOUS

## 2015-06-12 MED ORDER — OXYCODONE HCL 5 MG PO TABS
ORAL_TABLET | ORAL | Status: AC
  Filled 2015-06-12: qty 1

## 2015-06-12 MED ORDER — HYDROMORPHONE HCL 1 MG/ML IJ SOLN
0.2500 mg | INTRAMUSCULAR | Status: DC | PRN
  Administered 2015-06-12 (×2): 0.5 mg via INTRAVENOUS

## 2015-06-12 MED ORDER — ONDANSETRON HCL 4 MG/2ML IJ SOLN
INTRAMUSCULAR | Status: AC
  Filled 2015-06-12: qty 2

## 2015-06-12 MED ORDER — PROPOFOL 10 MG/ML IV BOLUS
INTRAVENOUS | Status: AC
  Filled 2015-06-12: qty 20

## 2015-06-12 MED ORDER — ACETAMINOPHEN 325 MG PO TABS
ORAL_TABLET | ORAL | Status: DC
Start: 2015-06-12 — End: 2015-06-12
  Filled 2015-06-12: qty 2

## 2015-06-12 MED ORDER — FENTANYL CITRATE (PF) 100 MCG/2ML IJ SOLN
INTRAMUSCULAR | Status: AC
  Filled 2015-06-12: qty 2

## 2015-06-12 MED ORDER — MEPERIDINE HCL 25 MG/ML IJ SOLN: 6.2500 mg | INTRAMUSCULAR | Status: DC | PRN

## 2015-06-12 MED ORDER — SUGAMMADEX SODIUM 500 MG/5ML IV SOLN
INTRAVENOUS | Status: AC
  Filled 2015-06-12: qty 5

## 2015-06-12 SURGICAL SUPPLY — 50 items
APPLIER CLIP 5 13 M/L LIGAMAX5 (MISCELLANEOUS)
BENZOIN TINCTURE PRP APPL 2/3 (GAUZE/BANDAGES/DRESSINGS) ×3 IMPLANT
CANISTER SUCTION 2500CC (MISCELLANEOUS) IMPLANT
CHLORAPREP W/TINT 26ML (MISCELLANEOUS) ×3 IMPLANT
CLIP APPLIE 5 13 M/L LIGAMAX5 (MISCELLANEOUS) IMPLANT
CLOSURE STERI-STRIP 1/4X4 (GAUZE/BANDAGES/DRESSINGS) ×3 IMPLANT
CLOSURE WOUND 1/2 X4 (GAUZE/BANDAGES/DRESSINGS) ×1
COVER SURGICAL LIGHT HANDLE (MISCELLANEOUS) ×3 IMPLANT
DISSECTOR BLUNT TIP ENDO 5MM (MISCELLANEOUS) IMPLANT
DRAPE LAPAROSCOPIC ABDOMINAL (DRAPES) ×3 IMPLANT
ELECT REM PT RETURN 9FT ADLT (ELECTROSURGICAL) ×3
ELECTRODE REM PT RTRN 9FT ADLT (ELECTROSURGICAL) ×1 IMPLANT
ENDOLOOP SUT PDS II  0 18 (SUTURE) ×2
ENDOLOOP SUT PDS II 0 18 (SUTURE) ×1 IMPLANT
GAUZE SPONGE 2X2 8PLY STRL LF (GAUZE/BANDAGES/DRESSINGS) ×1 IMPLANT
GAUZE SPONGE 4X4 12PLY STRL (GAUZE/BANDAGES/DRESSINGS) ×3 IMPLANT
GLOVE BIO SURGEON STRL SZ7 (GLOVE) ×9 IMPLANT
GLOVE BIO SURGEON STRL SZ7.5 (GLOVE) ×3 IMPLANT
GLOVE BIOGEL PI IND STRL 7.0 (GLOVE) ×2 IMPLANT
GLOVE BIOGEL PI INDICATOR 7.0 (GLOVE) ×4
GLOVE SURG SS PI 6.5 STRL IVOR (GLOVE) ×3 IMPLANT
GOWN STRL REUS W/ TWL LRG LVL3 (GOWN DISPOSABLE) ×3 IMPLANT
GOWN STRL REUS W/ TWL XL LVL3 (GOWN DISPOSABLE) ×1 IMPLANT
GOWN STRL REUS W/TWL LRG LVL3 (GOWN DISPOSABLE) ×6
GOWN STRL REUS W/TWL XL LVL3 (GOWN DISPOSABLE) ×2
KIT BASIN OR (CUSTOM PROCEDURE TRAY) ×3 IMPLANT
KIT ROOM TURNOVER OR (KITS) ×3 IMPLANT
MESH 3DMAX 5X7 LT XLRG (Mesh General) ×3 IMPLANT
MESH 3DMAX LIGHT 4.8X6.7 LT XL (Mesh General) IMPLANT
NEEDLE INSUFFLATION 14GA 120MM (NEEDLE) ×3 IMPLANT
NS IRRIG 1000ML POUR BTL (IV SOLUTION) ×3 IMPLANT
PAD ARMBOARD 7.5X6 YLW CONV (MISCELLANEOUS) ×6 IMPLANT
RELOAD STAPLE HERNIA 4.0 BLUE (INSTRUMENTS) ×3 IMPLANT
RELOAD STAPLE HERNIA 4.8 BLK (STAPLE) IMPLANT
SCISSORS LAP 5X35 DISP (ENDOMECHANICALS) ×3 IMPLANT
SET IRRIG TUBING LAPAROSCOPIC (IRRIGATION / IRRIGATOR) ×3 IMPLANT
SET TROCAR LAP APPLE-HUNT 5MM (ENDOMECHANICALS) ×3 IMPLANT
SPONGE GAUZE 2X2 STER 10/PKG (GAUZE/BANDAGES/DRESSINGS) ×2
STAPLER HERNIA 12 8.5 360D (INSTRUMENTS) ×3 IMPLANT
STRIP CLOSURE SKIN 1/2X4 (GAUZE/BANDAGES/DRESSINGS) ×2 IMPLANT
SUT MNCRL AB 4-0 PS2 18 (SUTURE) ×3 IMPLANT
SUT VIC AB 1 CT1 27 (SUTURE) ×2
SUT VIC AB 1 CT1 27XBRD ANBCTR (SUTURE) ×1 IMPLANT
TAPE CLOTH SURG 4X10 WHT LF (GAUZE/BANDAGES/DRESSINGS) ×3 IMPLANT
TOWEL OR 17X24 6PK STRL BLUE (TOWEL DISPOSABLE) ×3 IMPLANT
TOWEL OR 17X26 10 PK STRL BLUE (TOWEL DISPOSABLE) ×3 IMPLANT
TRAY FOLEY CATH 16FR SILVER (SET/KITS/TRAYS/PACK) ×3 IMPLANT
TRAY LAPAROSCOPIC MC (CUSTOM PROCEDURE TRAY) ×3 IMPLANT
TROCAR XCEL 12X100 BLDLESS (ENDOMECHANICALS) ×3 IMPLANT
TUBING INSUFFLATION (TUBING) ×3 IMPLANT

## 2015-06-12 NOTE — Discharge Instructions (Signed)
[facility] _______[facility] ° °INGUINAL HERNIA REPAIR: POST OP INSTRUCTIONS ° °Always review your discharge instruction sheet given to you by the facility where your surgery was performed. °IF YOU HAVE DISABILITY OR FAMILY LEAVE FORMS, YOU MUST BRING THEM TO THE OFFICE FOR PROCESSING.   °DO NOT GIVE THEM TO YOUR DOCTOR. ° °1. A  prescription for pain medication may be given to you upon discharge.  Take your pain medication as prescribed, if needed.  If narcotic pain medicine is not needed, then you may take acetaminophen (Tylenol) or ibuprofen (Advil) as needed. °2. Take your usually prescribed medications unless otherwise directed. °3. If you need a refill on your pain medication, please contact your pharmacy.  They will contact our office to request authorization. Prescriptions will not be filled after 5 pm or on week-ends. °4. You should follow a light diet the first 24 hours after arrival home, such as soup and crackers, etc.  Be sure to include lots of fluids daily.  Resume your normal diet the day after surgery. °5. Most patients will experience some swelling and bruising around the umbilicus or in the groin and scrotum.  Ice packs and reclining will help.  Swelling and bruising can take several days to resolve.  °6. It is common to experience some constipation if taking pain medication after surgery.  Increasing fluid intake and taking a stool softener (such as Colace) will usually help or prevent this problem from occurring.  A mild laxative (Milk of Magnesia or Miralax) should be taken according to package directions if there are no bowel movements after 48 hours. °7. Unless discharge instructions indicate otherwise, you may remove your bandages 24-48 hours after surgery, and you may shower at that time.  You may have steri-strips (small skin tapes) in place directly over the incision.  These strips should be left on the skin for 7-10 days.  If your surgeon used skin glue on the incision, you  may shower in 24 hours.  The glue will flake off over the next 2-3 weeks.  Any sutures or staples will be removed at the office during your follow-up visit. °8. ACTIVITIES:  You may resume regular (light) daily activities beginning the next day--such as daily self-care, walking, climbing stairs--gradually increasing activities as tolerated.  You may have sexual intercourse when it is comfortable.  Refrain from any heavy lifting or straining until approved by your doctor. °a. You may drive when you are no longer taking prescription pain medication, you can comfortably wear a seatbelt, and you can safely maneuver your car and apply brakes. °b. RETURN TO WORK:  __________________________________________________________ °9. You should see your doctor in the office for a follow-up appointment approximately 2-3 weeks after your surgery.  Make sure that you call for this appointment within a day or two after you arrive home to insure a convenient appointment time. °10. OTHER INSTRUCTIONS:  __________________________________________________________________________________________________________________________________________________________________________________________  °WHEN TO CALL YOUR DOCTOR: °1. Fever over 101.0 °2. Inability to urinate °3. Nausea and/or vomiting °4. Extreme swelling or bruising °5. Continued bleeding from incision. °6. Increased pain, redness, or drainage from the incision ° °The clinic staff is available to answer your questions during regular business hours.  Please don’t hesitate to call and ask to speak to one of the nurses for clinical concerns.  If you have a medical emergency, go to the nearest emergency room or call 911.  A surgeon from [facility] is always on call at the hospital ° ° °[address], [address], [place], [place]  [address] ? ° [address] [address], , [place]   [address] °[phone] ? [phone] ? FAX [phone] °Web site:  [email or web address] ° °

## 2015-06-12 NOTE — Progress Notes (Signed)
Providing lunch relief 

## 2015-06-12 NOTE — Anesthesia Preprocedure Evaluation (Addendum)
Anesthesia Evaluation  Patient identified by MRN, date of birth, ID band Patient awake    Reviewed: Allergy & Precautions, NPO status   Airway Mallampati: I  TM Distance: >3 FB Neck ROM: Full    Dental   Pulmonary    Pulmonary exam normal        Cardiovascular Normal cardiovascular exam     Neuro/Psych    GI/Hepatic   Endo/Other    Renal/GU      Musculoskeletal   Abdominal   Peds  Hematology   Anesthesia Other Findings   Reproductive/Obstetrics                            Anesthesia Physical Anesthesia Plan  ASA: II  Anesthesia Plan: General   Post-op Pain Management:    Induction: Intravenous  Airway Management Planned: Oral ETT  Additional Equipment:   Intra-op Plan:   Post-operative Plan: Extubation in OR  Informed Consent: I have reviewed the patients History and Physical, chart, labs and discussed the procedure including the risks, benefits and alternatives for the proposed anesthesia with the patient or authorized representative who has indicated his/her understanding and acceptance.     Plan Discussed with: CRNA and Surgeon  Anesthesia Plan Comments:        Anesthesia Quick Evaluation

## 2015-06-12 NOTE — Anesthesia Postprocedure Evaluation (Signed)
Anesthesia Post Note  Patient: Russ HaloCecil Mcfaul  Procedure(s) Performed: Procedure(s) (LRB): LAPAROSCOPIC LEFT INGUINAL HERNIA WITH MESH (Left) INSERTION OF MESH (Left)  Patient location during evaluation: PACU Anesthesia Type: General Level of consciousness: awake and alert Pain management: pain level controlled Vital Signs Assessment: post-procedure vital signs reviewed and stable Respiratory status: spontaneous breathing, nonlabored ventilation, respiratory function stable and patient connected to nasal cannula oxygen Cardiovascular status: blood pressure returned to baseline and stable Postop Assessment: no signs of nausea or vomiting Anesthetic complications: no    Last Vitals:  Filed Vitals:   06/12/15 1245 06/12/15 1255  BP: 132/95 127/76  Pulse: 73 87  Temp:    Resp: 12 18    Last Pain:  Filed Vitals:   06/12/15 1257  PainSc: 5                  Kato Wieczorek DAVID

## 2015-06-12 NOTE — Transfer of Care (Signed)
Immediate Anesthesia Transfer of Care Note  Patient: Gregory Foley  Procedure(s) Performed: Procedure(s): LAPAROSCOPIC LEFT INGUINAL HERNIA WITH MESH (Left) INSERTION OF MESH (Left)  Patient Location: PACU  Anesthesia Type:General  Level of Consciousness: awake, alert  and oriented  Airway & Oxygen Therapy: Patient Spontanous Breathing and Patient connected to nasal cannula oxygen  Post-op Assessment: Report given to RN and Post -op Vital signs reviewed and stable  Post vital signs: Reviewed and stable  Last Vitals:  Filed Vitals:   06/12/15 0813 06/12/15 1044  BP: 143/90 143/102  Pulse:  91  Temp:  36.4 C  Resp:      Complications: No apparent anesthesia complications

## 2015-06-12 NOTE — Anesthesia Procedure Notes (Signed)
Procedure Name: Intubation Date/Time: 06/12/2015 9:28 AM Performed by: Eligha Bridegroom Pre-anesthesia Checklist: Timeout performed, Patient identified, Emergency Drugs available, Suction available and Patient being monitored Patient Re-evaluated:Patient Re-evaluated prior to inductionOxygen Delivery Method: Circle system utilized Preoxygenation: Pre-oxygenation with 100% oxygen Intubation Type: IV induction Ventilation: Mask ventilation without difficulty Laryngoscope Size: Mac and 4 Grade View: Grade I Tube type: Oral Tube size: 7.5 mm Airway Equipment and Method: Stylet and LTA kit utilized Placement Confirmation: ETT inserted through vocal cords under direct vision,  breath sounds checked- equal and bilateral and positive ETCO2 Secured at: 22 cm Tube secured with: Tape Dental Injury: Teeth and Oropharynx as per pre-operative assessment

## 2015-06-12 NOTE — Op Note (Signed)
06/12/2015  10:33 AM  PATIENT:  Gregory Foley  59 y.o. male  PRE-OPERATIVE DIAGNOSIS:  LEFT RECURRENT INGUINAL HERNIA  POST-OPERATIVE DIAGNOSIS:  LEFT RECURRENT INDIRECT INGUINAL HERNIA  PROCEDURE:  Procedure(s): LAPAROSCOPIC LEFT INGUINAL HERNIA WITH MESH (Left) INSERTION OF MESH  EXCISION OF PREVIOUSLY PLACED MESH(Left)  SURGEON:  Surgeon(s) and Role:    * Axel FillerArmando Janani Chamber, MD - Primary  ANESTHESIA:   local and general  EBL:<5CC  Total I/O In: -  Out: 200 [Urine:200]  BLOOD ADMINISTERED:none  DRAINS: none   LOCAL MEDICATIONS USED:  BUPIVICAINE   SPECIMEN:  Previous mesh  DISPOSITION OF SPECIMEN:  Pathology  COUNTS:  YES  TOURNIQUET:  * No tourniquets in log *  DICTATION: .Dragon Dictation    Counts: reported as correct x 2  Findings:  The patient had a medium left indirect hernia and a previously placed Left direct mesh plug  Indications for procedure:  The patient is a 59 year old male with a left recurrent inguinal hernia for several months. Pt had this previously repaired multiple years ago via an open technique. Patient complained of symptomatology to his left inguinal area. The patient was taken back for elective recurrent inguinal hernia repair.  Details of the procedure: The patient was taken back to the operating room. The patient was placed in supine position with bilateral SCDs in place.  The patient was prepped and draped in the usual sterile fashion.  After appropriate anitbiotics were confirmed, a time-out was confirmed and all facts were verified.  0.25% Marcaine was used to infiltrate the umbilical area. A 11-blade was used to cut down the skin and blunt dissection was used to get the anterior fashion.  The anterior fascia was incised approximately 1 cm and the muscles were retracted laterally. Blunt dissection was then used to create a space in the preperitoneal area. At this time a 10 mm camera was then introduced into the space and advanced the  pubic tubercle and a 12 mm trocar was placed over this and insufflation was started.  At this time and space was created from medial to laterally the preperitoneal space.  Cooper's ligament was initially cleaned off.  The medium hernia sac was identified in the indirect space. Dissection of the hernia sac was undertaken the vas deferens was identified and protected in all parts of the case.  There was a small tear into the hernia sac. An 0 PDS Endoloop was used to ligate the defect in the peritoneum.    There was also a direct mesh plug that was seen.  Secondary to the fact that the patient had a complaint of pain and the fact that the mesh plug was going to interfere with the way the mesh I was going to lay that I was to insert, I bluntly dissected the mesh away with ease.  There hernia defect left behind was moderate in nature.  Once the hernia sac was taken down to approximately the umbilicus a Bard 3D Max mesh, size: Extra-Large, was  introduced into the preperitoneal space.  The mesh was brought over to cover the direct and indirect hernia spaces.  This was anchored into place and secured to Cooper's ligament with 4.410mm staples from a Coviden hernia stapler. It was anchored to the anterior abdominal wall with 4.8 mm staples. The hernia sac was seen lying posterior to the mesh. There was no staples placed laterally. The insufflation was evacuated and the peritoneum was seen posterior to the mesh. The trochars were removed. The anterior fascia  was reapproximated using #1 Vicryl on a UR- 6.  Intra-abdominal air was evacuated and the Veress needle removed. The skin was reapproximated using 4-0 Monocryl subcuticular fashion the patient was awakened from general anesthesia and taken to recovery in stable condition.   PLAN OF CARE: Discharge to home after PACU  PATIENT DISPOSITION:  PACU - hemodynamically stable.   Delay start of Pharmacological VTE agent (>24hrs) due to surgical blood loss or risk of  bleeding: not applicable

## 2015-06-13 ENCOUNTER — Encounter (HOSPITAL_COMMUNITY): Payer: Self-pay | Admitting: General Surgery

## 2015-07-25 ENCOUNTER — Other Ambulatory Visit: Payer: Self-pay | Admitting: Physician Assistant

## 2015-07-28 ENCOUNTER — Ambulatory Visit (INDEPENDENT_AMBULATORY_CARE_PROVIDER_SITE_OTHER): Payer: Commercial Managed Care - HMO | Admitting: Physician Assistant

## 2015-07-28 ENCOUNTER — Encounter: Payer: Self-pay | Admitting: Physician Assistant

## 2015-07-28 VITALS — BP 140/90 | HR 89 | Temp 97.6°F | Ht 72.0 in | Wt 226.2 lb

## 2015-07-28 DIAGNOSIS — M5090 Cervical disc disorder, unspecified, unspecified cervical region: Secondary | ICD-10-CM | POA: Diagnosis not present

## 2015-07-28 MED ORDER — METHYLPREDNISOLONE 4 MG PO TBPK: ORAL_TABLET | ORAL | Status: DC

## 2015-07-28 NOTE — Assessment & Plan Note (Signed)
Flare with nerve involvement. Continue Gabapentin and pain medication. Will add-on Medrol pack. Supportive measures reviewed. Follow-up 1 week.

## 2015-07-28 NOTE — Patient Instructions (Signed)
Please continue the pain medication and gabapentin. Take the Medrol pack as directed. No heavy lifting. Apply [facility] or Aspercreme to the neck.  Follow-up with me next week for the pain and for a BP recheck. Return sooner if anything worsens.

## 2015-07-28 NOTE — Progress Notes (Signed)
Patient presents to clinic today c/o 10-day flare of neck pain with sharp pain radiating into RUE with numbness and tingling.Gregory Foley symptoms started after an episode of heavy lifting. Denies trauma. Has been taking Percocet to help with pain.   Past Medical History  Diagnosis Date  . 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 Herniation of lumbar intervertebral disc     and Cervical Neck  . Arthritis     Back  . Kidney stone     Last 25 years ago  . Elevated blood pressure     pt states that he was treated for a short time for elevated BP but PCP didn't think he needed it    Current Outpatient Prescriptions on File Prior to Visit  Medication Sig Dispense Refill  . gabapentin (NEURONTIN) 100 MG capsule TAKE 1 CAPSULE BY MOUTH THREE TIMES DAILY 90 capsule 1  . oxyCODONE-acetaminophen (PERCOCET/ROXICET) 5-325 MG tablet Take 1-2 tablets by mouth every 4 (four) hours as needed for moderate pain. 60 tablet 0   No current facility-administered medications on file prior to visit.    No Known Allergies  Family History  Problem Relation Age of Onset  . Arthritis Mother     Living  . Prostate cancer Father 60    Deceased  . Stroke Father   . Bladder Cancer Brother   . Alzheimer's disease Maternal Grandfather   . Heart disease Other     Maternal Aunts & Uncles  . Cancer Other     Paternal Aunts & Uncles  . Heart disease Sister     x1  . Healthy Brother     x3  . Psychiatric Illness Brother     x1    Social History   Social History  . Marital Status: Married    Spouse Name: N/A  . Number of Children: 0  . Years of Education: N/A   Social History Main Topics  . Smoking status: Never Smoker   . Smokeless tobacco: Never Used  . Alcohol Use: No  . Drug Use: No  . Sexual Activity: Not Asked   Other Topics Concern  . None   Social History Narrative   Review of Systems - See HPI.  All other ROS are negative.  BP 140/90 mmHg  Pulse 89   Temp(Src) 97.6 F (36.4 C) (Oral)  Ht 6' (1.829 m)  Wt 226 lb 3.2 oz (102.604 kg)  BMI 30.67 kg/m2  SpO2 100%  Physical Exam  Constitutional: He is oriented to person, place, and time and well-developed, well-nourished, and in no distress.  HENT:  Head: Normocephalic and atraumatic.  Eyes: Conjunctivae are normal.  Cardiovascular: Normal rate, regular rhythm, normal heart sounds and intact distal pulses.   Pulmonary/Chest: Effort normal and breath sounds normal. No respiratory distress. He has no wheezes. He has no rales. He exhibits no tenderness.  Musculoskeletal:       Cervical back: He exhibits pain. He exhibits normal range of motion, no tenderness, no bony tenderness and no spasm.  Neurological: He is alert and oriented to person, place, and time.  Skin: Skin is warm and dry. No rash noted.  Psychiatric: Affect normal.  Vitals reviewed.  Recent Results (from the past 2160 hour(s))  CBC     Status: None   Collection Time: 06/09/15  8:41 AM  Result Value Ref Range   WBC 5.9 4.0 - 10.5 K/uL   RBC 5.12 4.22 - 5.81  MIL/uL   Hemoglobin 13.8 13.0 - 17.0 g/dL   HCT 42.5 39.0 - 52.0 %   MCV 83.0 78.0 - 100.0 fL   MCH 27.0 26.0 - 34.0 pg   MCHC 32.5 30.0 - 36.0 g/dL   RDW 13.9 11.5 - 15.5 %   Platelets 167 150 - 400 K/uL  Basic metabolic panel     Status: None   Collection Time: 06/09/15  8:41 AM  Result Value Ref Range   Sodium 139 135 - 145 mmol/L   Potassium 4.2 3.5 - 5.1 mmol/L   Chloride 107 101 - 111 mmol/L   CO2 27 22 - 32 mmol/L   Glucose, Bld 95 65 - 99 mg/dL   BUN 12 6 - 20 mg/dL   Creatinine, Ser 1.15 0.61 - 1.24 mg/dL   Calcium 9.4 8.9 - 10.3 mg/dL   GFR calc non Af Amer >60 >60 mL/min   GFR calc Af Amer >60 >60 mL/min    Comment: (NOTE) The eGFR has been calculated using the CKD EPI equation. This calculation has not been validated in all clinical situations. eGFR's persistently <60 mL/min signify possible Chronic Kidney Disease.    Anion gap 5 5 - 15     Assessment/Plan: Cervical disc disease Flare with nerve involvement. Continue Gabapentin and pain medication. Will add-on Medrol pack. Supportive measures reviewed. Follow-up 1 week.

## 2015-07-28 NOTE — Progress Notes (Signed)
Pre visit review using our clinic review tool, if applicable. No additional management support is needed unless otherwise documented below in the visit note. 

## 2015-08-08 ENCOUNTER — Other Ambulatory Visit: Payer: Self-pay | Admitting: Physician Assistant

## 2015-08-08 DIAGNOSIS — R911 Solitary pulmonary nodule: Secondary | ICD-10-CM

## 2015-08-09 ENCOUNTER — Ambulatory Visit (HOSPITAL_BASED_OUTPATIENT_CLINIC_OR_DEPARTMENT_OTHER)
Admission: RE | Admit: 2015-08-09 | Discharge: 2015-08-09 | Disposition: A | Discharge status: Home / Self Care | Payer: Commercial Managed Care - HMO | Source: Ambulatory Visit | Attending: Physician Assistant | Admitting: Physician Assistant

## 2015-08-09 DIAGNOSIS — R911 Solitary pulmonary nodule: Secondary | ICD-10-CM | POA: Diagnosis not present

## 2015-08-09 DIAGNOSIS — R918 Other nonspecific abnormal finding of lung field: Secondary | ICD-10-CM | POA: Diagnosis not present

## 2015-08-30 ENCOUNTER — Telehealth: Payer: Medicare Other | Admitting: Nurse Practitioner

## 2015-08-30 DIAGNOSIS — R059 Cough, unspecified: Secondary | ICD-10-CM

## 2015-08-30 DIAGNOSIS — R05 Cough: Secondary | ICD-10-CM

## 2015-08-30 MED ORDER — AZITHROMYCIN 250 MG PO TABS: ORAL_TABLET | ORAL | Status: DC

## 2015-08-30 MED ORDER — BENZONATATE 100 MG PO CAPS: 100.0000 mg | ORAL_CAPSULE | Freq: Three times a day (TID) | ORAL | Status: DC | PRN

## 2015-08-30 NOTE — Progress Notes (Signed)

## 2015-09-21 ENCOUNTER — Other Ambulatory Visit: Payer: Self-pay | Admitting: Physician Assistant

## 2015-10-04 ENCOUNTER — Other Ambulatory Visit: Payer: Self-pay | Admitting: Physician Assistant

## 2016-01-01 ENCOUNTER — Other Ambulatory Visit: Payer: Self-pay | Admitting: Physician Assistant

## 2016-01-01 NOTE — Telephone Encounter (Signed)
Refill sent per [facility] refill protocol/SLS  

## 2016-03-28 ENCOUNTER — Encounter: Payer: Self-pay | Admitting: Medical

## 2016-03-28 ENCOUNTER — Encounter: Payer: Self-pay | Admitting: Podiatry

## 2016-03-28 ENCOUNTER — Ambulatory Visit (HOSPITAL_BASED_OUTPATIENT_CLINIC_OR_DEPARTMENT_OTHER)
Admission: RE | Admit: 2016-03-28 | Discharge: 2016-03-28 | Disposition: A | Discharge status: Home / Self Care | Payer: Commercial Managed Care - HMO | Source: Ambulatory Visit | Attending: Medical | Admitting: Medical

## 2016-03-28 ENCOUNTER — Ambulatory Visit (INDEPENDENT_AMBULATORY_CARE_PROVIDER_SITE_OTHER): Payer: Commercial Managed Care - HMO | Admitting: Podiatry

## 2016-03-28 ENCOUNTER — Ambulatory Visit (INDEPENDENT_AMBULATORY_CARE_PROVIDER_SITE_OTHER): Payer: Commercial Managed Care - HMO | Admitting: Medical

## 2016-03-28 VITALS — BP 144/96 | HR 96 | Resp 14

## 2016-03-28 VITALS — BP 143/88 | HR 73 | Temp 97.8°F | Ht 72.0 in | Wt 207.2 lb

## 2016-03-28 DIAGNOSIS — L600 Ingrowing nail: Secondary | ICD-10-CM

## 2016-03-28 DIAGNOSIS — L03031 Cellulitis of right toe: Secondary | ICD-10-CM

## 2016-03-28 DIAGNOSIS — M11211 Other chondrocalcinosis, right shoulder: Secondary | ICD-10-CM | POA: Diagnosis not present

## 2016-03-28 DIAGNOSIS — M25511 Pain in right shoulder: Secondary | ICD-10-CM

## 2016-03-28 DIAGNOSIS — S46811A Strain of other muscles, fascia and tendons at shoulder and upper arm level, right arm, initial encounter: Secondary | ICD-10-CM | POA: Diagnosis not present

## 2016-03-28 DIAGNOSIS — R937 Abnormal findings on diagnostic imaging of other parts of musculoskeletal system: Secondary | ICD-10-CM | POA: Insufficient documentation

## 2016-03-28 MED ORDER — DOXYCYCLINE HYCLATE 100 MG PO TABS: 100.0000 mg | ORAL_TABLET | Freq: Two times a day (BID) | ORAL | 0 refills | Status: DC

## 2016-03-28 MED ORDER — TRAMADOL HCL 50 MG PO TABS: 50.0000 mg | ORAL_TABLET | Freq: Four times a day (QID) | ORAL | 0 refills | Status: DC | PRN

## 2016-03-28 NOTE — Patient Instructions (Signed)
For your rt shoulder pain will get xray of shoulder. Continue gabapentin. Will rx tramadol for moderate to severe  pain. Can continue alleve otc. Will refer to sports management.   For your probable ingrown toenail with infection/forming abscess will refer you to podiatrist. Wilfrid LundWil go ahead and prescribe doxycycline oral antibiotic.   Follow up in 7-10 days or as needed

## 2016-03-28 NOTE — Progress Notes (Signed)
Pre visit review using our clinic tool,if applicable. No additional management support is needed unless otherwise documented below in the visit note.  

## 2016-03-28 NOTE — Progress Notes (Signed)
   Subjective:    Patient ID: Russ HaloCecil Reznik, male    DOB: 10/19/1955, 60 y.o.   MRN: [id number]  HPI    Review of Systems  All other systems reviewed and are negative.      Objective:   Physical Exam GENERAL APPEARANCE: Alert, conversant. Appropriately groomed. No acute distress.  VASCULAR: Pedal pulses are  palpable at  [facility] bilateral.  Capillary refill time is immediate to all digits,  Normal temperature gradient.  Digital hair growth is present bilateral  NEUROLOGIC: sensation is normal to 5.07 monofilament at 5/5 sites bilateral.  Light touch is intact bilateral, Muscle strength normal.  MUSCULOSKELETAL: acceptable muscle strength, tone and stability bilateral.  Intrinsic muscluature intact bilateral.  Rectus appearance of foot and digits noted bilateral.   DERMATOLOGIC: skin color, texture, and turgor are within normal limits.  No preulcerative lesions or ulcers  are seen, no interdigital maceration noted.  No open lesions present.   No drainage noted.  NAILS  Red swollen infected proximal nail fold lateral border right great toe.         Assessment & Plan:  Paronychia right hallux.    IE  Incision and drainage of the lateral border of the right great toe.  Treatment options and alternatives discussed.  Recommended an incision and drainage and patient agreed.  Right hallux  was prepped with alcohol and a 3cc. of  2% lidocaine plain was administered in a digital block fashion.  The toe was then prepped with betadine solution .  The offending nail border was then excised and all necrotic tissue was resected.  The area was then cleansed  and antibiotic ointment and a dry sterile dressing was applied.  The patient was dispensed instructions for aftercare. Return to clinic in one week. Patient was told to take the doxycycline that [facility]   Helane GuntherGregory Chevi Lim DPM

## 2016-03-28 NOTE — Progress Notes (Signed)
Subjective:    Patient ID: Gregory Foley, male    DOB: Feb 25, 1956, 60 y.o.   MRN: [id number]  HPI   Pt in for rt great toe swelling. Pain is at base of rt great toenail. No trauma or injury. He was working in the yard and shoe may have gotten soaked doing yard work. Only watering the yard. No insect bite. No spider bite. Pt is not diabetic. Pt states other night had transient warm sensation. Pt states about 3 days since toe has swollen. Pain since feb 2017.   Pt also states some rt side shoulder area/trapezius area pain. Pt states his orthopedist has evaluated the area. He had xray of neck area. No cause found. He had fusion cervical spine area. No pain that radiates to his arm. He does have some pain toward. Pain hurts worse when he walks or moves. On lifting items. Pt has was referred to pain specialist. Pt had some injections in neck area. But pain did not improve. Pt is on gabapentin. He takes tid.     Review of Systems  Constitutional: Positive for fever.  HENT: Negative for congestion and facial swelling.   Respiratory: Negative for chest tightness and shortness of breath.   Cardiovascular: Negative for chest pain and palpitations.  Gastrointestinal: Negative for abdominal pain, nausea and rectal pain.  Musculoskeletal: Negative for back pain.       Rt great toe area pain.  Rt shoulder pain.  Skin: Negative for rash.  Neurological: Negative for dizziness and headaches.  Hematological: Negative for adenopathy. Does not bruise/bleed easily.  Psychiatric/Behavioral: Negative for behavioral problems and confusion.    Past Medical History:  Diagnosis Date  . Amputation of hand, right (HCC)    1981 -- Reattached at [facility]  . Arthritis    Back  . Elevated blood pressure    pt states that he was treated for a short time for elevated BP but PCP didn't think he needed it  . Herniation of lumbar intervertebral disc    and Cervical Neck  . Kidney stone    Last 25 years ago       Social History   Social History  . Marital status: Married    Spouse name: N/A  . Number of children: 0  . Years of education: N/A   Occupational History  . Not on file.   Social History Main Topics  . Smoking status: Never Smoker  . Smokeless tobacco: Never Used  . Alcohol use No  . Drug use: No  . Sexual activity: Not on file   Other Topics Concern  . Not on file   Social History Narrative  . No narrative on file    Past Surgical History:  Procedure Laterality Date  . BACK SURGERY     2012   2016 (oct 10)   . CERVICAL FUSION     2009  . COLONOSCOPY    . INGUINAL HERNIA REPAIR     x4  . INGUINAL HERNIA REPAIR Left 06/12/2015   Procedure: LAPAROSCOPIC LEFT INGUINAL HERNIA WITH MESH;  Surgeon: Axel FillerArmando Ramirez, MD;  [facility];  Service: General;  Laterality: Left;  . INSERTION OF MESH Left 06/12/2015   Procedure: INSERTION OF MESH;  Surgeon: Axel FillerArmando Ramirez, MD;  [facility];  Service: General;  Laterality: Left;  . LUMBAR LAMINECTOMY     2011  . medial facetectomy    . REATTACHMENT HAND     1981    Family History  Problem Relation Age of Onset  . Arthritis Mother     Living  . Prostate cancer Father 29    Deceased  . Stroke Father   . Bladder Cancer Brother   . Alzheimer's disease Maternal Grandfather   . Heart disease Other     Maternal Aunts & Uncles  . Cancer Other     Paternal Aunts & Uncles  . Heart disease Sister     x1  . Healthy Brother     x3  . Psychiatric Illness Brother     x1    No Known Allergies  Current Outpatient Prescriptions on File Prior to Visit  Medication Sig Dispense Refill  . gabapentin (NEURONTIN) 100 MG capsule TAKE 1 CAPSULE BY MOUTH THREE TIMES DAILY 270 capsule 0   No current facility-administered medications on file prior to visit.     BP (!) 143/88   Pulse 73   Temp 97.8 F (36.6 C) (Oral)   Ht 6' (1.829 m)   Wt 207 lb 3.2 oz (94 kg)   SpO2 100%   BMI 28.10 kg/m       Objective:    Physical Exam  General- No acute distress. Pleasant patient. Neck- Full range of motion, no jvd. On turrning head to left has left has rt side trapezius pain. No mid cspine pain presently. Lungs- Clear, even and unlabored. Heart- regular rate and rhythm. Neurologic- CNII- XII grossly intact.  Rt shoulder- mild faint crepitus on rom. Faint anterior shoulder pain on palpation region of bicep tendon.  Rt great toe- swollen and induration at base of nail. Medial aspect more tender. Faint dry yellow crusting appearance.   Skin- no redness of foot. No tracking. No lower ext lymphadenopathy.       Assessment & Plan:  For your rt shoulder pain will get xray of shoulder. Continue gabapentin. Will rx tramadol for moderate to severe  pain. Can continue alleve otc. Will refer to sports management.   For your probable ingrown toenail with infection/forming abscess will refer you to podiatrist. Wilfrid Lund go ahead and prescribe doxycycline oral antibiotic.   Follow up in 7-10 days or as needed  Lillah Standre, Ramon Dredge, [employer]

## 2016-04-01 ENCOUNTER — Telehealth: Payer: Self-pay | Admitting: Physician Assistant

## 2016-04-01 NOTE — Telephone Encounter (Signed)
Will forward to ordering provider.

## 2016-04-01 NOTE — Telephone Encounter (Signed)
Patient is calling regarding imaging results. Please advise.   Patient relation: self Patient phone: [phone]

## 2016-04-02 NOTE — Telephone Encounter (Signed)
Mailed copy of results to patient

## 2016-04-02 NOTE — Telephone Encounter (Signed)
Pt calling on his xray results. Please notify him of those. Thanks.

## 2016-04-04 ENCOUNTER — Encounter: Payer: Self-pay | Admitting: Podiatry

## 2016-04-04 ENCOUNTER — Ambulatory Visit (INDEPENDENT_AMBULATORY_CARE_PROVIDER_SITE_OTHER): Payer: Commercial Managed Care - HMO | Admitting: Podiatry

## 2016-04-04 DIAGNOSIS — Z09 Encounter for follow-up examination after completed treatment for conditions other than malignant neoplasm: Secondary | ICD-10-CM

## 2016-04-04 NOTE — Progress Notes (Signed)
This patient returns to the office following nail surgery one week ago.  The patient says toe has been soaked and bandaged as directed.  There has been improvement of the toe since the surgery has been performed. The patient presents for continued evaluation and treatment.  GENERAL APPEARANCE: Alert, conversant. Appropriately groomed. No acute distress.  VASCULAR: Pedal pulses palpable at  DP and PT bilateral.  Capillary refill time is immediate to all digits,  Normal temperature gradient.    NEUROLOGIC: sensation is normal to 5.07 monofilament at 5/5 sites bilateral.  Light touch is intact bilateral, Muscle strength normal.  MUSCULOSKELETAL: acceptable muscle strength, tone and stability bilateral.  Intrinsic muscluature intact bilateral.  Rectus appearance of foot and digits noted bilateral.   DERMATOLOGIC: skin color, texture, and turgor are within normal limits.  No preulcerative lesions or ulcers  are seen, no interdigital maceration noted.   NAILS  There is necrotic tissue along the nail groove  In the absence of redness swelling and pain.  DX  S/p nail surgery  ROV  Home instructions were discussed.  Patient to call the office if there are any questions or concerns.   Rayhaan Huster DPM   

## 2016-04-05 ENCOUNTER — Ambulatory Visit (INDEPENDENT_AMBULATORY_CARE_PROVIDER_SITE_OTHER): Payer: Commercial Managed Care - HMO | Admitting: Family Medicine

## 2016-04-05 ENCOUNTER — Encounter: Payer: Self-pay | Admitting: Family Medicine

## 2016-04-05 DIAGNOSIS — M25511 Pain in right shoulder: Secondary | ICD-10-CM | POA: Diagnosis not present

## 2016-04-05 NOTE — Patient Instructions (Signed)
Your shoulder exam is normal. I would follow up with Dr. Danielle DessElsner regarding this - your pain is either due to an irritated cervical nerve or trapezius strain/spasms. We discussed repeating prednisone dose pack, physical therapy, increasing your gabapentin, MRI/CT myelogram, epidural steroid injections (depending on the imaging results). Call me if you have any questions or concerns - I will forward my note to him when it's complete as well.

## 2016-04-10 DIAGNOSIS — M25511 Pain in right shoulder: Secondary | ICD-10-CM | POA: Insufficient documentation

## 2016-04-10 NOTE — Assessment & Plan Note (Signed)
shoulder exam is benign, reassuring.  Pain is either due to trapezius strain/spasms or irritated low cervical nerve/radiculopathy.  He will follow up with Dr. Danielle DessElsner for further recommendations.  We discussed repeating prednisone, physical therapy, increasing gabapentin, MRI or CT myelogram, ESIs vs facet injections as well.  Follow up with us as needed.

## 2016-04-10 NOTE — Progress Notes (Signed)
PCP and consultation requested by: Piedad ClimesMartin, William Cody, PA-C  Subjective:   HPI: Patient is a 60 y.o. male here for right shoulder pain.  Patient referred to us for 8 months of posterior right shoulder pain. Recalls lifting something around when this started which may have caused pain. Pain level is 8/10, sharp. Worse with movement of neck and lifting items. Had radiographs by his neurosurgeon (is s/p fusion) and told nothing to cause his current pain. No swelling or bruising. No numbness or tingling. Is taking gabapentin 300 tid, tramadol, and aleve. No bowel/bladder dysfunction.  Past Medical History:  Diagnosis Date  . Amputation of hand, right (HCC)    1981 -- Reattached at [facility]  . Arthritis    Back  . Elevated blood pressure    pt states that he was treated for a short time for elevated BP but PCP didn't think he needed it  . Herniation of lumbar intervertebral disc    and Cervical Neck  . Kidney stone    Last 25 years ago    Current Outpatient Prescriptions on File Prior to Visit  Medication Sig Dispense Refill  . doxycycline (VIBRA-TABS) 100 MG tablet Take 1 tablet (100 mg total) by mouth 2 (two) times daily. 14 tablet 0  . gabapentin (NEURONTIN) 100 MG capsule TAKE 1 CAPSULE BY MOUTH THREE TIMES DAILY 270 capsule 0  . traMADol (ULTRAM) 50 MG tablet Take 1 tablet (50 mg total) by mouth every 6 (six) hours as needed for severe pain. 16 tablet 0   No current facility-administered medications on file prior to visit.     Past Surgical History:  Procedure Laterality Date  . BACK SURGERY     2012   2016 (oct 10)   . CERVICAL FUSION     2009  . COLONOSCOPY    . INGUINAL HERNIA REPAIR     x4  . INGUINAL HERNIA REPAIR Left 06/12/2015   Procedure: LAPAROSCOPIC LEFT INGUINAL HERNIA WITH MESH;  Surgeon: Axel FillerArmando Ramirez, MD;  [facility];  Service: General;  Laterality: Left;  . INSERTION OF MESH Left 06/12/2015   Procedure: INSERTION OF MESH;  Surgeon: Axel FillerArmando  Ramirez, MD;  [facility];  Service: General;  Laterality: Left;  . LUMBAR LAMINECTOMY     2011  . medial facetectomy    . REATTACHMENT HAND     1981    No Known Allergies  Social History   Social History  . Marital status: Married    Spouse name: N/A  . Number of children: 0  . Years of education: N/A   Occupational History  . Not on file.   Social History Main Topics  . Smoking status: Never Smoker  . Smokeless tobacco: Never Used  . Alcohol use No  . Drug use: No  . Sexual activity: Not on file   Other Topics Concern  . Not on file   Social History Narrative  . No narrative on file    Family History  Problem Relation Age of Onset  . Arthritis Mother     Living  . Prostate cancer Father [id number]    Deceased  . Stroke Father   . Bladder Cancer Brother   . Alzheimer's disease Maternal Grandfather   . Heart disease Other     Maternal Aunts & Uncles  . Cancer Other     Paternal Aunts & Uncles  . Heart disease Sister     x1  . Healthy Brother     x3  .  Psychiatric Illness Brother     x1    BP 129/84   Pulse 69   Ht 6' (1.829 m)   Wt 207 lb (93.9 kg)   BMI 28.07 kg/m   Review of Systems: See HPI above.    Objective:  Physical Exam:  Gen: NAD, comfortable in exam room  Right shoulder: No swelling, ecchymoses.  No gross deformity. TTP right trapezius, rhomboids.  No other tenderness. FROM shoulder without pain.  Negative painful arc. Negative Hawkins, Neers. Negative Yergasons. Strength 5/5 with empty can and resisted internal/external rotation without pain. Negative apprehension. NV intact distally.  Left shoulder: FROM without pain    Assessment & Plan:  1. Right shoulder pain - shoulder exam is benign, reassuring.  Pain is either due to trapezius strain/spasms or irritated low cervical nerve/radiculopathy.  He will follow up with Dr. Danielle Dess for further recommendations.  We discussed repeating prednisone, physical therapy, increasing  gabapentin, MRI or CT myelogram, ESIs vs facet injections as well.  Follow up with [place] as needed.

## 2016-08-10 ENCOUNTER — Telehealth: Payer: Self-pay | Admitting: Physician Assistant

## 2016-08-10 DIAGNOSIS — R911 Solitary pulmonary nodule: Secondary | ICD-10-CM

## 2016-08-10 NOTE — Telephone Encounter (Signed)
-----   Message from Waldon MerlWilliam C Anden Bartolo, PA-C sent at 08/09/2015 12:31 PM EST ----- Repeat CT chest w/o contrast 1 year -- stable 3 mm pulmonary nodule

## 2016-08-10 NOTE — Telephone Encounter (Signed)
Patient is due for a repeat CT chest due to history of pulmonary nodule. Need to assess continued stability. Let me know if he is ok with me ordering. Also he has not been seen in over a year and has never come in to see us for a physical (only acute visits). Recommend he schedule an appointment for CPE.

## 2016-08-12 ENCOUNTER — Encounter: Payer: Self-pay | Admitting: Emergency Medicine

## 2016-08-12 NOTE — Telephone Encounter (Signed)
My chart message sent to patient to schedule repeat CT and to schedule appointment for physical

## 2016-08-13 NOTE — Telephone Encounter (Signed)
Patient responded by My chart and agreed to the repeat CT chest w/out contrast. Order placed.

## 2016-08-14 ENCOUNTER — Ambulatory Visit (HOSPITAL_BASED_OUTPATIENT_CLINIC_OR_DEPARTMENT_OTHER): Payer: Commercial Managed Care - HMO

## 2016-09-28 IMAGING — CT CT ABD-PELV W/O CM
2 of 4 series · 15 of 46 positions shown, 17 images · non-contrast
Comparison: None.

CLINICAL DATA: Incarcerated left direct inguinal hernia.

EXAM:
CT ABDOMEN AND PELVIS WITHOUT CONTRAST
TECHNIQUE: Multidetector CT imaging of the abdomen and pelvis was performed
following the standard protocol without IV contrast.

[Series 2: abd/pelvis 5.0 b31f · axial · 0.88mm/px · z∈[+665,+1210]mm · 12 of 119 slices shown, 14 images]
[im 5/119  soft-tissue]
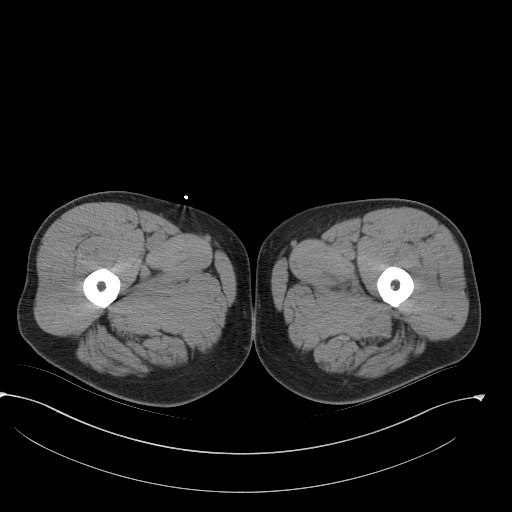
[im 5/119  bone]
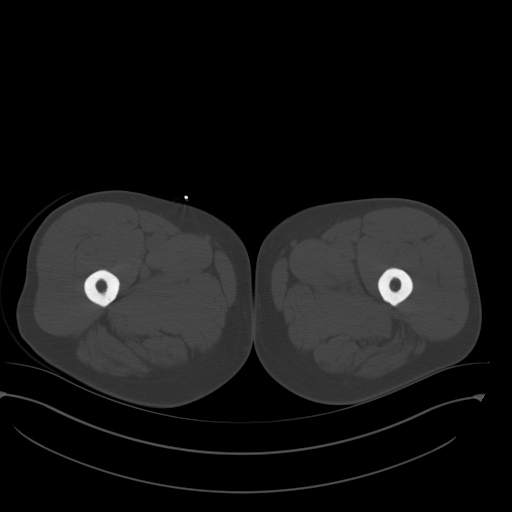
[im 15/119  soft-tissue]
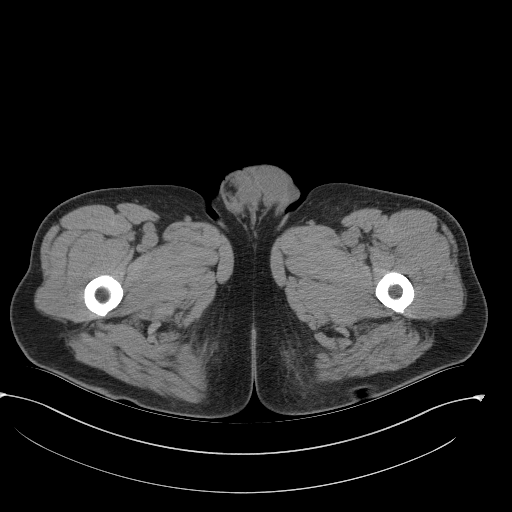
[im 25/119  soft-tissue]
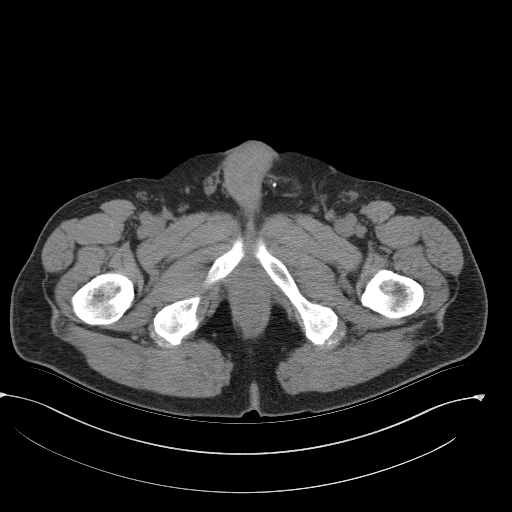
[im 35/119  soft-tissue]
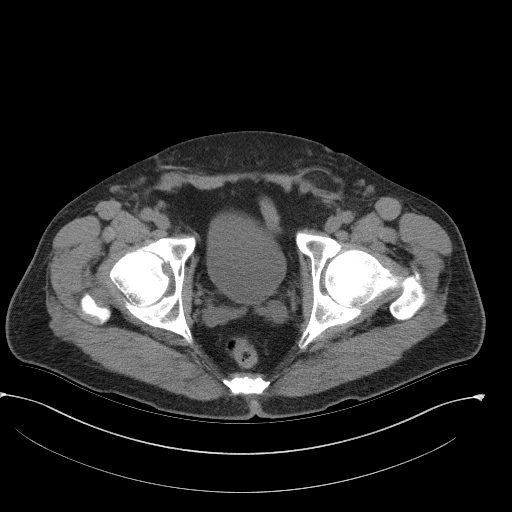
[im 45/119  soft-tissue]
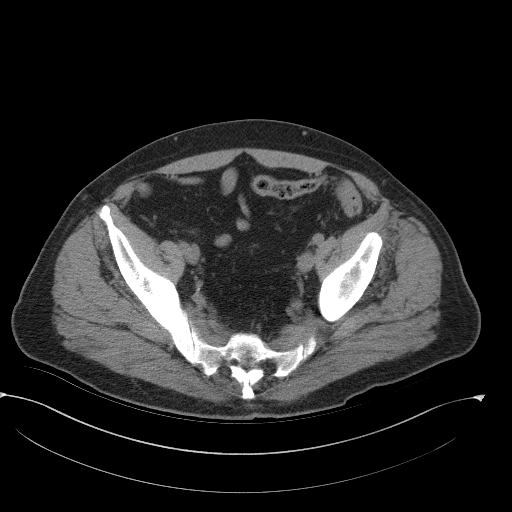
[im 55/119  soft-tissue]
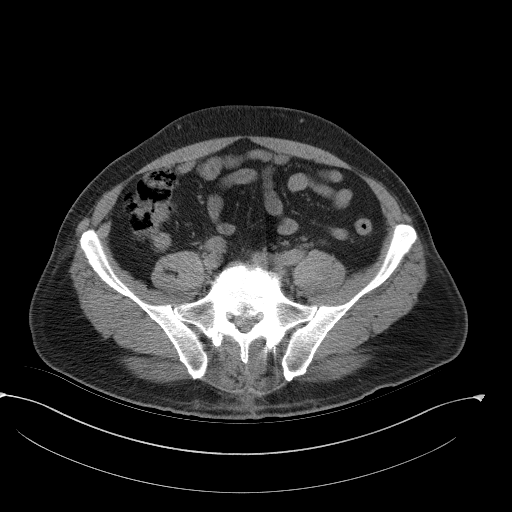
[im 64/119  soft-tissue]
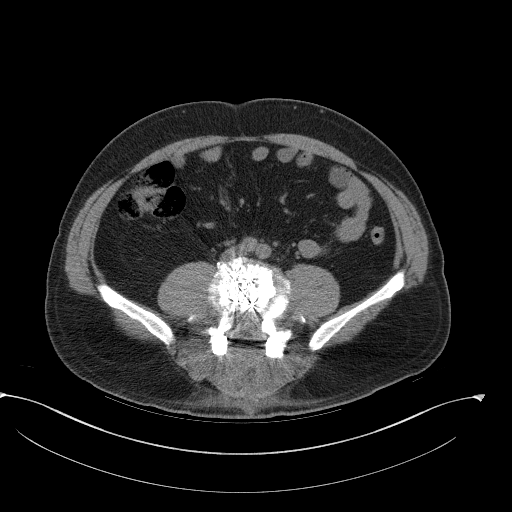
[im 74/119  soft-tissue]
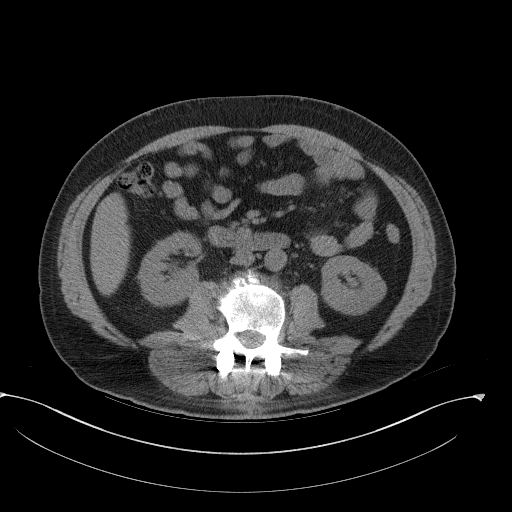
[im 84/119  soft-tissue]
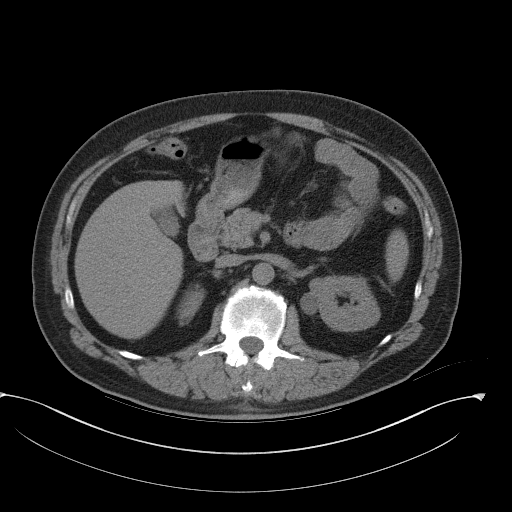
[im 84/119  bone]
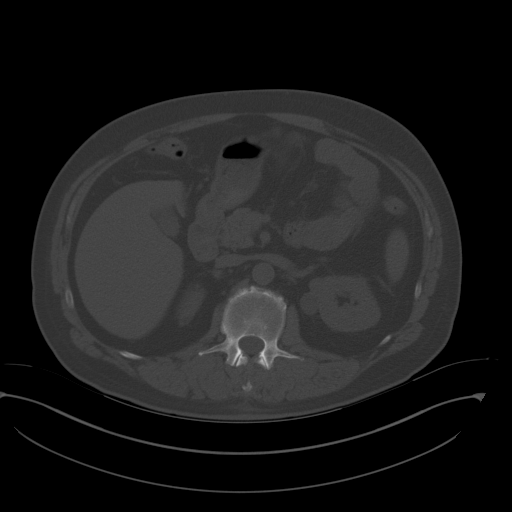
[im 94/119  soft-tissue]
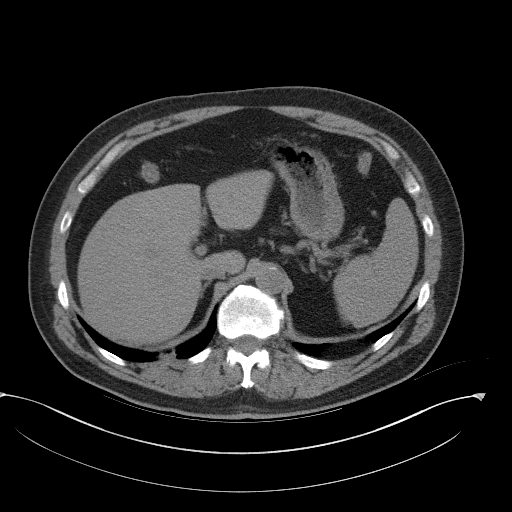
[im 104/119  soft-tissue]
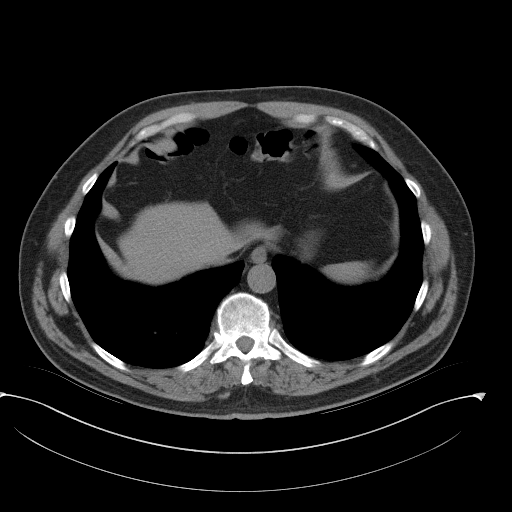
[im 114/119  soft-tissue]
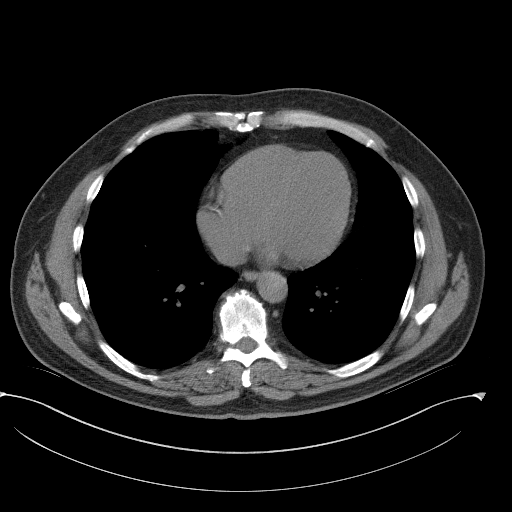

[Series 5: abd/pelvis 3.0 coronal · coronal · 0.95mm/px · 3 of 99 slices shown]
[im 33/99  soft-tissue]
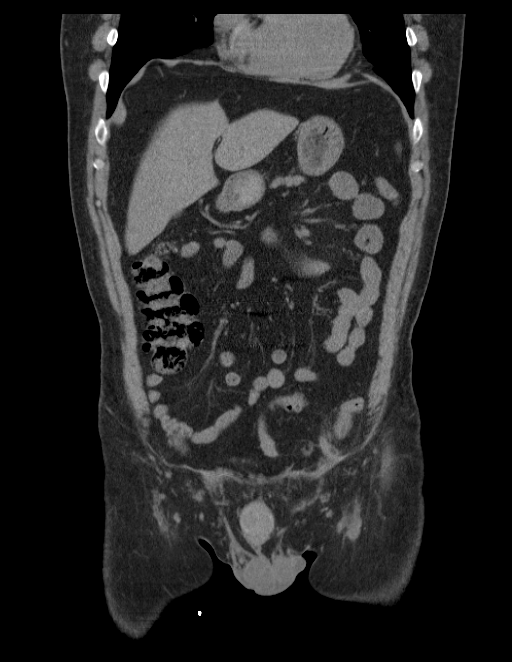
[im 44/99  soft-tissue]
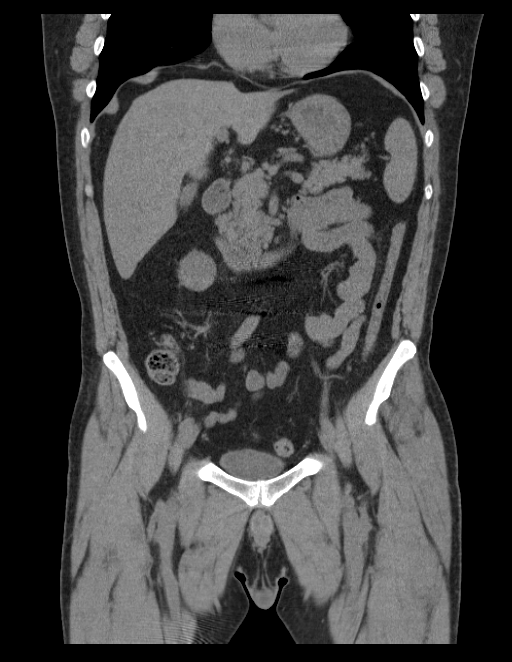
[im 55/99  soft-tissue]
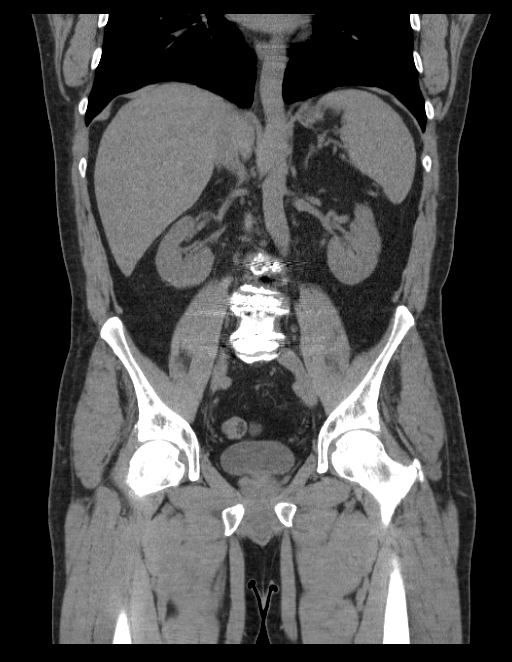

[15 of 46 positions shown; findings below may reference images not displayed]

FINDINGS: Lower chest: Right middle lobe solid 3 mm pulmonary nodule (series
4/ image 3). Subpleural 1.7 x 0.7 cm focus of consolidation in the
basilar right lower lobe (series 4/ image 26). Small fat containing
Bochdalek's hernia on the left. Right coronary atherosclerosis.

Hepatobiliary: Normal liver, with no liver mass. Normal gallbladder
with no radiopaque cholelithiasis. No biliary ductal dilatation.

Pancreas: Normal, with no mass or duct dilation.

Spleen: Normal size. No mass.

Adrenals/Urinary Tract: Normal adrenals. Simple 1.9 cm exophytic
renal cyst in the medial upper left kidney. No additional contour
deforming renal masses. No hydronephrosis. No nephrolithiasis.
Normal bladder.

Stomach/Bowel: Grossly normal stomach. Normal caliber small bowel
with no small bowel wall thickening. Normal appendix. There is a
small to moderate left inguinal hernia, which appears indirect (the
hernia sac enters the left inguinal canal lateral to the left
inferior epigastric vessels). A tiny portion of the proximal left
sigmoid colon extends into the very proximal left inguinal canal. No
large bowel wall thickening, diverticulosis or pericolonic fat
stranding. No pneumatosis.

Vascular/Lymphatic: Atherosclerotic nonaneurysmal abdominal aorta.
No pathologically enlarged lymph nodes in the abdomen or pelvis.

Reproductive: Top-normal size prostate with nonspecific internal
prostatic calcifications.

Other: No pneumoperitoneum or ascites. Trace focal fluid is seen
adjacent to the proximal right inguinal canal, with no evidence of a
right inguinal hernia.

Musculoskeletal: No aggressive appearing focal osseous lesions. Tiny
sclerotic foci in the bilateral pubic rami are nonspecific and
likely represent benign bone islands. Status post bilateral
posterior spinal fusion from L3-S1, with bone cages at L3-4, L4-5
and L5-S1. Moderate degenerative changes throughout the visualized
thoracolumbar spine.
IMPRESSION: 1. Small to moderate left inguinal hernia, which appears indirect,
and contains a tiny portion of the proximal sigmoid colon. No
evidence of bowel obstruction or bowel ischemia.
2. Subpleural small focus of consolidation in the basilar right
lower lobe, indeterminate, favor focal nodular scarring or a small
inflammatory focus, however a lung neoplasm is not excluded.
Separate right middle lobe solid 3 mm pulmonary nodule. A follow-up
chest CT is advised in 3 months.

## 2017-10-16 NOTE — Telephone Encounter (Signed)
Spoke with patient and advised he was due for the repeat CT of Chest for pulmonary nodule. Patient scheduled for CPE

## 2017-10-20 ENCOUNTER — Other Ambulatory Visit: Payer: Self-pay

## 2017-10-20 ENCOUNTER — Encounter: Payer: Self-pay | Admitting: Physician Assistant

## 2017-10-20 ENCOUNTER — Ambulatory Visit (INDEPENDENT_AMBULATORY_CARE_PROVIDER_SITE_OTHER): Payer: Commercial Managed Care - PPO | Admitting: Physician Assistant

## 2017-10-20 VITALS — BP 118/86 | HR 65 | Temp 97.9°F | Resp 14 | Ht 71.0 in | Wt 193.0 lb

## 2017-10-20 DIAGNOSIS — Z1211 Encounter for screening for malignant neoplasm of colon: Secondary | ICD-10-CM | POA: Diagnosis not present

## 2017-10-20 DIAGNOSIS — Z0001 Encounter for general adult medical examination with abnormal findings: Secondary | ICD-10-CM | POA: Diagnosis not present

## 2017-10-20 DIAGNOSIS — M2550 Pain in unspecified joint: Secondary | ICD-10-CM

## 2017-10-20 DIAGNOSIS — M25511 Pain in right shoulder: Secondary | ICD-10-CM | POA: Diagnosis not present

## 2017-10-20 DIAGNOSIS — R351 Nocturia: Secondary | ICD-10-CM

## 2017-10-20 DIAGNOSIS — Z125 Encounter for screening for malignant neoplasm of prostate: Secondary | ICD-10-CM | POA: Diagnosis not present

## 2017-10-20 DIAGNOSIS — N401 Enlarged prostate with lower urinary tract symptoms: Secondary | ICD-10-CM | POA: Insufficient documentation

## 2017-10-20 DIAGNOSIS — Z23 Encounter for immunization: Secondary | ICD-10-CM | POA: Diagnosis not present

## 2017-10-20 DIAGNOSIS — R911 Solitary pulmonary nodule: Secondary | ICD-10-CM

## 2017-10-20 DIAGNOSIS — R0602 Shortness of breath: Secondary | ICD-10-CM | POA: Diagnosis not present

## 2017-10-20 DIAGNOSIS — Z0000 Encounter for general adult medical examination without abnormal findings: Secondary | ICD-10-CM | POA: Insufficient documentation

## 2017-10-20 LAB — COMPREHENSIVE METABOLIC PANEL
ALBUMIN: 4.3 g/dL (ref 3.5–5.2)
ALK PHOS: 52 U/L (ref 39–117)
ALT: 14 U/L (ref 0–53)
AST: 17 U/L (ref 0–37)
BUN: 16 mg/dL (ref 6–23)
CO2: 26 mEq/L (ref 19–32)
Calcium: 9.3 mg/dL (ref 8.4–10.5)
Chloride: 107 mEq/L (ref 96–112)
Creatinine, Ser: 0.9 mg/dL (ref 0.40–1.50)
GFR: 91.07 mL/min (ref >60.00)
Glucose, Bld: 90 mg/dL (ref 70–99)
POTASSIUM: 4.2 meq/L (ref 3.5–5.1)
SODIUM: 142 meq/L (ref 135–145)
TOTAL PROTEIN: 7.1 g/dL (ref 6.0–8.3)
Total Bilirubin: 0.8 mg/dL (ref 0.2–1.2)

## 2017-10-20 LAB — CBC WITH DIFFERENTIAL/PLATELET
Basophils Absolute: 0.1 10*3/uL (ref 0.0–0.1)
Basophils Relative: 1.1 % (ref 0.0–3.0)
Eosinophils Absolute: 0.1 10*3/uL (ref 0.0–0.7)
Eosinophils Relative: 1.8 % (ref 0.0–5.0)
HEMATOCRIT: 44.9 % (ref 39.0–52.0)
HEMOGLOBIN: 15.7 g/dL (ref 13.0–17.0)
LYMPHS PCT: 26.9 % (ref 12.0–46.0)
Lymphs Abs: 1.4 10*3/uL (ref 0.7–4.0)
MCHC: 34.9 g/dL (ref 30.0–36.0)
MCV: 86.8 fl (ref 78.0–100.0)
MONOS PCT: 10.1 % (ref 3.0–12.0)
Monocytes Absolute: 0.5 10*3/uL (ref 0.1–1.0)
Neutro Abs: 3.2 10*3/uL (ref 1.4–7.7)
Neutrophils Relative %: 60.1 % (ref 43.0–77.0)
Platelets: 159 10*3/uL (ref 150.0–400.0)
RBC: 5.18 Mil/uL (ref 4.22–5.81)
RDW: 13.3 % (ref 11.5–15.5)
WBC: 5.4 10*3/uL (ref 4.0–10.5)

## 2017-10-20 LAB — LIPID PANEL
Cholesterol: 209 mg/dL — ABNORMAL HIGH (ref 0–200)
HDL: 45.1 mg/dL (ref >39.00)
LDL Cholesterol: 143 mg/dL — ABNORMAL HIGH (ref 0–99)
NONHDL: 163.66
Total CHOL/HDL Ratio: 5
Triglycerides: 102 mg/dL (ref 0.0–149.0)
VLDL: 20.4 mg/dL (ref 0.0–40.0)

## 2017-10-20 LAB — SEDIMENTATION RATE: Sed Rate: 15 mm/hr (ref 0–20)

## 2017-10-20 LAB — HEMOGLOBIN A1C: Hgb A1c MFr Bld: 5.4 % (ref 4.6–6.5)

## 2017-10-20 LAB — PSA: PSA: 0.88 ng/mL (ref 0.10–4.00)

## 2017-10-20 NOTE — Assessment & Plan Note (Signed)
Depression screen negative. Health Maintenance reviewed. Preventive schedule discussed and handout given in AVS. Will obtain fasting labs today. TDaP updated.

## 2017-10-20 NOTE — Assessment & Plan Note (Signed)
Chronic. Exam unremarkable. Will check ESR today. If normal, recommend Ortho assessment. Recommend he start a daily Turmeric supplement.

## 2017-10-20 NOTE — Assessment & Plan Note (Signed)
The natural history of prostate cancer and ongoing controversy regarding screening and potential treatment outcomes of prostate cancer has been discussed with the patient. The meaning of a false positive PSA and a false negative PSA has been discussed. He indicates understanding of the limitations of this screening test and wishes  to proceed with screening PSA testing.  

## 2017-10-20 NOTE — Patient Instructions (Addendum)
Please go to the lab for blood work.   Our office will call you with your results unless you have chosen to receive results via MyChart.  If your blood work is normal we will follow-up each year for physicals and as scheduled for chronic medical problems.  If anything is abnormal we will treat accordingly and get you in for a follow-up.  You will be contacted to schedule a screening colonoscopy.  I want you to start a daily Turmeric supplement (500 mg) which is good for inflammation in the joints.  We are checking labs for inflammation -- if abnormal, we will have you see Rheumatology. If negative, I want to set you up with Orthopedics for further assessment/management of this chronic R shoulder issue.    Preventive Care 40-64 Years, Male Preventive care refers to lifestyle choices and visits with your health care provider that can promote health and wellness. What does preventive care include?  A yearly physical exam. This is also called an annual well check.  Dental exams once or twice a year.  Routine eye exams. Ask your health care provider how often you should have your eyes checked.  Personal lifestyle choices, including: ? Daily care of your teeth and gums. ? Regular physical activity. ? Eating a healthy diet. ? Avoiding tobacco and drug use. ? Limiting alcohol use. ? Practicing safe sex. ? Taking low-dose aspirin every day starting at age 44. What happens during an annual well check? The services and screenings done by your health care provider during your annual well check will depend on your age, overall health, lifestyle risk factors, and family history of disease. Counseling Your health care provider may ask you questions about your:  Alcohol use.  Tobacco use.  Drug use.  Emotional well-being.  Home and relationship well-being.  Sexual activity.  Eating habits.  Work and work Statistician.  Screening You may have the following tests or  measurements:  Height, weight, and BMI.  Blood pressure.  Lipid and cholesterol levels. These may be checked every 5 years, or more frequently if you are over 34 years old.  Skin check.  Lung cancer screening. You may have this screening every year starting at age 10 if you have a 30-pack-year history of smoking and currently smoke or have quit within the past 15 years.  Fecal occult blood test (FOBT) of the stool. You may have this test every year starting at age 60.  Flexible sigmoidoscopy or colonoscopy. You may have a sigmoidoscopy every 5 years or a colonoscopy every 10 years starting at age 61.  Prostate cancer screening. Recommendations will vary depending on your family history and other risks.  Hepatitis C blood test.  Hepatitis B blood test.  Sexually transmitted disease (STD) testing.  Diabetes screening. This is done by checking your blood sugar (glucose) after you have not eaten for a while (fasting). You may have this done every 1-3 years.  Discuss your test results, treatment options, and if necessary, the need for more tests with your health care provider. Vaccines Your health care provider may recommend certain vaccines, such as:  Influenza vaccine. This is recommended every year.  Tetanus, diphtheria, and acellular pertussis (Tdap, Td) vaccine. You may need a Td booster every 10 years.  Varicella vaccine. You may need this if you have not been vaccinated.  Zoster vaccine. You may need this after age 8.  Measles, mumps, and rubella (MMR) vaccine. You may need at least one dose of MMR if you  were born in [birth year] or later. You may also need a second dose.  Pneumococcal 13-valent conjugate (PCV13) vaccine. You may need this if you have certain conditions and have not been vaccinated.  Pneumococcal polysaccharide (PPSV23) vaccine. You may need one or two doses if you smoke cigarettes or if you have certain conditions.  Meningococcal vaccine. You may need this if  you have certain conditions.  Hepatitis A vaccine. You may need this if you have certain conditions or if you travel or work in places where you may be exposed to hepatitis A.  Hepatitis B vaccine. You may need this if you have certain conditions or if you travel or work in places where you may be exposed to hepatitis B.  Haemophilus influenzae type b (Hib) vaccine. You may need this if you have certain risk factors.  Talk to your health care provider about which screenings and vaccines you need and how often you need them. This information is not intended to replace advice given to you by your health care provider. Make sure you discuss any questions you have with your health care provider. Document Released: 07/07/2015 Document Revised: 02/28/2016 Document Reviewed: 04/11/2015 Elsevier Interactive Patient Education  [employer].  .

## 2017-10-20 NOTE — Assessment & Plan Note (Signed)
Referral to GI placed for colonoscopy.  

## 2017-10-20 NOTE — Assessment & Plan Note (Signed)
Exam unremarkable. Will check ESR today. If elevated, will add on RF and anti-CCP

## 2017-10-20 NOTE — Progress Notes (Signed)
Patient presents to clinic today for annual exam.  Patient is fasting for labs.  Acute Concerns: Patient with chronic pain in R shoulder, previously assessed by Sports Medicine with x-rays revealing chondrocalcinosis and arthritic changes. Notes aching pain currently, mainly at night. Denies any numbness, tingling or weakness. Notes stiffness in joints of hands each morning taking about an hour to work out. Denies any redness or swelling of joints that he has noted. Denies any known history of gout or pseudogout attacks.   Chronic Issues: Pulmonary Nodules -- Noted on prior CT. Overdue for recheck. Denies any cough, hemoptysis or SOB.   Health Maintenance: Immunizations -- Tetanus due. Agrees.  Colonoscopy -- Due. Has been > 10 years.    Past Medical History:  Diagnosis Date  . 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 Arthritis    Back  . Elevated blood pressure    pt states that he was treated for a short time for elevated BP but PCP didn't think he needed it  . Herniation of lumbar intervertebral disc    and Cervical Neck  . Kidney stone    Last 25 years ago    Past Surgical History:  Procedure Laterality Date  . BACK SURGERY     2012   2016 (oct 10)   . CERVICAL FUSION     2009  . COLONOSCOPY    . INGUINAL HERNIA REPAIR     x4  . INGUINAL HERNIA REPAIR Left 06/12/2015   Procedure: LAPAROSCOPIC LEFT INGUINAL HERNIA WITH MESH;  Surgeon: Ralene Ok, MD;  Location: [place];  Service: General;  Laterality: Left;  . INSERTION OF MESH Left 06/12/2015   Procedure: INSERTION OF MESH;  Surgeon: Ralene Ok, MD;  Location: [place];  Service: General;  Laterality: Left;  . LUMBAR LAMINECTOMY     2011  . medial facetectomy    . REATTACHMENT HAND     1981    No current outpatient medications on file prior to visit.   No current facility-administered medications on file prior to visit.     No Known Allergies  Family History  Problem Relation  Age of Onset  . Arthritis Mother        Living  . Prostate cancer Father 17       Deceased  . Stroke Father   . Bladder Cancer Brother   . Alzheimer's disease Maternal Grandfather   . Heart disease Other        Maternal Aunts & Uncles  . Cancer Other        Paternal Aunts & Uncles  . Heart disease Sister        x1  . Healthy Brother        x3  . Psychiatric Illness Brother        x1    Social History   Socioeconomic History  . Marital status: Married    Spouse name: Not on file  . Number of children: 0  . Years of education: Not on file  . Highest education level: Not on file  Occupational History  . Not on file  Social Needs  . Financial resource strain: Not on file  . Food insecurity:    Worry: Not on file    Inability: Not on file  . Transportation needs:    Medical: Not on file    Non-medical: Not on file  Tobacco Use  . Smoking status: Never Smoker  .  Smokeless tobacco: Never Used  Substance and Sexual Activity  . Alcohol use: No    Alcohol/week: 0.0 oz  . Drug use: No  . Sexual activity: Yes  Lifestyle  . Physical activity:    Days per week: Not on file    Minutes per session: Not on file  . Stress: Not on file  Relationships  . Social connections:    Talks on phone: Not on file    Gets together: Not on file    Attends religious service: Not on file    Active member of club or organization: Not on file    Attends meetings of clubs or organizations: Not on file    Relationship status: Not on file  . Intimate partner violence:    Fear of current or ex partner: Not on file    Emotionally abused: Not on file    Physically abused: Not on file    Forced sexual activity: Not on file  Other Topics Concern  . Not on file  Social History Narrative  . Not on file   Review of Systems  Constitutional: Negative for fever and weight loss.  HENT: Negative for ear discharge, ear pain, hearing loss and tinnitus.   Eyes: Negative for blurred vision, double  vision, photophobia and pain.  Respiratory: Negative for cough and shortness of breath.   Cardiovascular: Negative for chest pain and palpitations.  Gastrointestinal: Negative for abdominal pain, blood in stool, constipation, diarrhea, heartburn, melena, nausea and vomiting.  Genitourinary: Negative for dysuria, flank pain, frequency, hematuria and urgency.       Nocturia x 4-5.  Negative hesitancy. Notes post-void dribbling.   Musculoskeletal: Positive for joint pain. Negative for back pain, falls and myalgias.  Neurological: Negative for dizziness, loss of consciousness and headaches.  Endo/Heme/Allergies: Negative for environmental allergies.  Psychiatric/Behavioral: Negative for depression, hallucinations, substance abuse and suicidal ideas. The patient is not nervous/anxious and does not have insomnia.    BP 118/86   Pulse 65   Temp 97.9 F (36.6 C) (Oral)   Resp 14   Ht '5\' 11"'$  (1.803 m)   Wt 193 lb (87.5 kg)   SpO2 98%   BMI 26.92 kg/m   Physical Exam  Constitutional: He is oriented to person, place, and time. He appears well-developed and well-nourished. No distress.  HENT:  Head: Normocephalic and atraumatic.  Right Ear: Tympanic membrane, external ear and ear canal normal.  Left Ear: Tympanic membrane, external ear and ear canal normal.  Nose: Nose normal.  Mouth/Throat: Oropharynx is clear and moist and mucous membranes are normal. No posterior oropharyngeal edema or posterior oropharyngeal erythema.  Eyes: Pupils are equal, round, and reactive to light. Conjunctivae are normal.  Neck: Neck supple. No thyromegaly present.  Cardiovascular: Normal rate, regular rhythm, normal heart sounds and intact distal pulses.  Pulmonary/Chest: Effort normal and breath sounds normal. No respiratory distress. He has no wheezes. He has no rales. He exhibits no tenderness.  Abdominal: Soft. Bowel sounds are normal. He exhibits no distension and no mass. There is no tenderness. There is no  rebound and no guarding.  Genitourinary: Rectum normal. Prostate is enlarged (some left-sided enlargement noted. No palpable nodule). Prostate is not tender.  Musculoskeletal:       Right shoulder: Normal.  Lymphadenopathy:    He has no cervical adenopathy.  Neurological: He is alert and oriented to person, place, and time. No cranial nerve deficit.  Skin: Skin is warm and dry. No rash noted. He is  not diaphoretic.  Psychiatric: He has a normal mood and affect.  Vitals reviewed.  Assessment/Plan: Benign prostatic hyperplasia with nocturia Noted n examination. Patient with nocturia x 4-5. Will check PSA today. If normal will start trial of Flomax. If abnormal, will send to Urology for further assessment/management. Discussed dietary changes and supportive measures.   Right shoulder pain Chronic. Exam unremarkable. Will check ESR today. If normal, recommend Ortho assessment. Recommend he start a daily Turmeric supplement.   Arthralgia Exam unremarkable. Will check ESR today. If elevated, will add on RF and anti-CCP  Colon cancer screening Referral to GI placed for colonoscopy.   Prostate cancer screening The natural history of prostate cancer and ongoing controversy regarding screening and potential treatment outcomes of prostate cancer has been discussed with the patient. The meaning of a false positive PSA and a false negative PSA has been discussed. He indicates understanding of the limitations of this screening test and wishes to proceed with screening PSA testing.   Pulmonary nodule Asymptomatic. Lungs CTAB. Will repeat CT to assess.   Visit for preventive health examination Depression screen negative. Health Maintenance reviewed. Preventive schedule discussed and handout given in AVS. Will obtain fasting labs today. TDaP updated.     Leeanne Rio, PA-C

## 2017-10-20 NOTE — Assessment & Plan Note (Signed)
Asymptomatic. Lungs CTAB. Will repeat CT to assess.

## 2017-10-20 NOTE — Assessment & Plan Note (Signed)
Noted n examination. Patient with nocturia x 4-5. Will check PSA today. If normal will start trial of Flomax. If abnormal, will send to Urology for further assessment/management. Discussed dietary changes and supportive measures.

## 2017-10-21 ENCOUNTER — Other Ambulatory Visit: Payer: Self-pay | Admitting: Physician Assistant

## 2017-10-21 DIAGNOSIS — N401 Enlarged prostate with lower urinary tract symptoms: Secondary | ICD-10-CM

## 2017-10-21 DIAGNOSIS — R0602 Shortness of breath: Secondary | ICD-10-CM

## 2017-10-21 DIAGNOSIS — R351 Nocturia: Secondary | ICD-10-CM

## 2017-10-21 MED ORDER — TAMSULOSIN HCL 0.4 MG PO CAPS: 0.4000 mg | ORAL_CAPSULE | Freq: Every day | ORAL | 1 refills | Status: DC

## 2017-11-07 ENCOUNTER — Telehealth: Payer: Self-pay | Admitting: Emergency Medicine

## 2017-11-07 NOTE — Telephone Encounter (Signed)
-----   Message from Waldon Merl, PA-C sent at 11/05/2017  7:45 AM EDT ----- Regarding: FW: Referral Please reach out to patient once more regarding this.   ----- Message ----- From: Leonor Liv Sent: 11/04/2017   4:24 PM To: Waldon Merl, PA-C Subject: Referral                                       Good afternoon,  I just wanted you to be aware that we contacted patient three times to schedule ETT with no success.

## 2017-11-07 NOTE — Telephone Encounter (Signed)
Patient was called and left message to call Cardiology to schedule ETT.

## 2017-12-31 ENCOUNTER — Encounter: Payer: Self-pay | Admitting: Physician Assistant

## 2018-08-04 ENCOUNTER — Ambulatory Visit (INDEPENDENT_AMBULATORY_CARE_PROVIDER_SITE_OTHER): Payer: Commercial Managed Care - PPO

## 2018-08-04 DIAGNOSIS — Z23 Encounter for immunization: Secondary | ICD-10-CM

## 2018-08-04 NOTE — Progress Notes (Signed)
Patient here today for flu shot. 0.14mL flu shot given in left deltoid IM. Patient tolerated well.Marland Kitchen VIS given.

## 2019-05-04 ENCOUNTER — Telehealth: Payer: Self-pay | Admitting: Physician Assistant

## 2019-05-04 NOTE — Telephone Encounter (Signed)
LM asking pt to call back to let us know if he still plans on using [facility] as his PCP and if so to please schedule a CPE appt with him. ELEA  °

## 2019-06-29 ENCOUNTER — Encounter: Payer: Self-pay | Admitting: Physician Assistant

## 2019-06-29 ENCOUNTER — Ambulatory Visit (INDEPENDENT_AMBULATORY_CARE_PROVIDER_SITE_OTHER): Payer: Commercial Managed Care - PPO | Admitting: Physician Assistant

## 2019-06-29 ENCOUNTER — Other Ambulatory Visit: Payer: Self-pay

## 2019-06-29 VITALS — BP 130/90 | Temp 98.7°F

## 2019-06-29 DIAGNOSIS — U071 COVID-19: Secondary | ICD-10-CM | POA: Diagnosis not present

## 2019-06-29 NOTE — Progress Notes (Signed)
I have discussed the procedure for the virtual visit with the patient who has given consent to proceed with assessment and treatment.   Melvena Vink S Nichola Warren, CMA     

## 2019-06-29 NOTE — Progress Notes (Signed)
   Virtual Visit via Video   I connected with patient on 06/29/19 at  2:00 PM EST by a video enabled telemedicine application and verified that I am speaking with the correct person using two identifiers.  Location patient: Home Location provider: [facility], Office Persons participating in the virtual visit: Patient, Provider, CMA (Patina Moore)  I discussed the limitations of evaluation and management by telemedicine and the availability of in person appointments. The patient expressed understanding and agreed to proceed.  Subjective:   HPI:   Patient presents via doxy doxy.me today complaining of loss of taste and smell over the past 2 weeks.  Patient endorses on Christmas starting with fever, chills, aches and sinus congestion.  Notes that her symptoms lasted for a week or so and then completely resolved.  Had some residual fatigue which is mostly resolved now.  Notes he lost his sense of taste and smell on 06/22/2019 which has not returned.  Patient notes wife with similar symptoms starting around the same time.  Also has fellow church members who have recently tested positive for Covid, him they have been around.  ROS:   See pertinent positives and negatives per HPI.  Patient Active Problem List   Diagnosis Date Noted  . Visit for preventive health examination 10/20/2017  . Prostate cancer screening 10/20/2017  . Colon cancer screening 10/20/2017  . Arthralgia 10/20/2017  . Pulmonary nodule 10/20/2017  . Benign prostatic hyperplasia with nocturia 10/20/2017  . Right shoulder pain 04/10/2016  . Chronic lumbar radiculopathy 02/22/2015  . Cervical disc disease 02/22/2015  . Arthritis of multiple sites 02/22/2015    Social History   Tobacco Use  . Smoking status: Never Smoker  . Smokeless tobacco: Never Used  Substance Use Topics  . Alcohol use: No    Alcohol/week: 0.0 standard drinks    Current Outpatient Medications:  .  tamsulosin (FLOMAX) 0.4 MG CAPS  capsule, Take 1 capsule (0.4 mg total) by mouth daily., Disp: 30 capsule, Rfl: 1  No Known Allergies  Objective:   There were no vitals taken for this visit.  Patient is well-developed, well-nourished in no acute distress.  Resting comfortably at home.  Head is normocephalic, atraumatic.  No labored breathing.  Speech is clear and coherent with logical content.  Patient is alert and oriented at baseline.    Assessment and Plan:   1. COVID-19 Symptoms beginning on Christmas.  Were now 12 days out from this with complete resolution of all symptoms except for loss of taste and smell.  No indication for testing at this point.  Should be free to come out of quarantine but needs to follow social distancing protocols.  Recommend he inform anyone has been around without a mask of Covid infection.  Supportive measures reviewed.  Discussed that return of taste and smell can be highly variable and we will just have to monitor.  Patient voiced understanding and agreement with plan.   Piedad Climes, PA-C 06/29/2019

## 2022-05-09 ENCOUNTER — Ambulatory Visit (INDEPENDENT_AMBULATORY_CARE_PROVIDER_SITE_OTHER): Payer: Medicare Other | Admitting: Medical

## 2022-05-09 ENCOUNTER — Encounter: Payer: Self-pay | Admitting: Medical

## 2022-05-09 ENCOUNTER — Ambulatory Visit (HOSPITAL_BASED_OUTPATIENT_CLINIC_OR_DEPARTMENT_OTHER)
Admission: RE | Admit: 2022-05-09 | Discharge: 2022-05-09 | Disposition: A | Discharge status: Home / Self Care | Payer: Medicare Other | Source: Ambulatory Visit | Attending: Medical | Admitting: Medical

## 2022-05-09 VITALS — BP 135/80 | HR 80 | Temp 98.0°F | Resp 18 | Ht 71.0 in | Wt 189.8 lb

## 2022-05-09 DIAGNOSIS — M542 Cervicalgia: Secondary | ICD-10-CM | POA: Diagnosis not present

## 2022-05-09 DIAGNOSIS — Z1211 Encounter for screening for malignant neoplasm of colon: Secondary | ICD-10-CM | POA: Diagnosis not present

## 2022-05-09 DIAGNOSIS — R030 Elevated blood-pressure reading, without diagnosis of hypertension: Secondary | ICD-10-CM

## 2022-05-09 DIAGNOSIS — Z23 Encounter for immunization: Secondary | ICD-10-CM | POA: Diagnosis not present

## 2022-05-09 DIAGNOSIS — M5416 Radiculopathy, lumbar region: Secondary | ICD-10-CM

## 2022-05-09 DIAGNOSIS — Z1283 Encounter for screening for malignant neoplasm of skin: Secondary | ICD-10-CM

## 2022-05-09 DIAGNOSIS — L989 Disorder of the skin and subcutaneous tissue, unspecified: Secondary | ICD-10-CM

## 2022-05-09 DIAGNOSIS — E785 Hyperlipidemia, unspecified: Secondary | ICD-10-CM

## 2022-05-09 NOTE — Addendum Note (Signed)
Addended by: Maximino Sarin on: 05/09/2022 08:49 AM   Modules accepted: Orders

## 2022-05-09 NOTE — Progress Notes (Signed)
Subjective:    Patient ID: Gregory Foley, male    DOB: [birth year], 66 y.o.   MRN: [id number]  HPI  Pt in for first time with me.   Pt retired Building services engineer, stopped working about 10 years ago. Pt states walks 2-3 miles every day. Pt admits not best diet. He does eat fried foods. Non smoker. No alcohol. 2 dr pepper a week.  More than 10 years since he had colonoscopy. States last one was normal.  Will get flu vaccine today and pneumonia vaccine.  Pt does mention some rt side neck pain and points to rt trapezius area. States seems to occur when walking. He notices 10 minutes into a walk. Then during the day the pain persists.   Pt states most of time when he checks his bp it is 150/90. These checks are early in the morning. But he also notes when checks second time in a row will get 130/80 range.  Also he mentions hx of 4 lower back surgeries. Pt states a lot of equipment in back. Sometimes at night will have sciatica type pain to rt lower leg.  He has concern about to moles/lesions on back. Pt lays in tanning bed twice a week. Also a lot of sun exposure over the years.  Review of Systems  Constitutional:  Negative for chills, fatigue and fever.  Respiratory:  Negative for cough, chest tightness, shortness of breath and wheezing.   Cardiovascular:  Negative for chest pain and palpitations.  Gastrointestinal:  Negative for abdominal pain and blood in stool.  Musculoskeletal:  Positive for back pain and neck pain. Negative for myalgias and neck stiffness.  Skin:  Negative for rash.       See hpi.  Psychiatric/Behavioral:  Negative for behavioral problems, decreased concentration, dysphoric mood and suicidal ideas. The patient is not nervous/anxious and is not hyperactive.     Past Medical History:  Diagnosis Date   Amputation of hand, right (HCC)    1981 -- Reattached at [facility]   Arthritis    Back   Elevated blood pressure    pt states that he was treated for a short time for  elevated BP but PCP didn't think he needed it   Herniation of lumbar intervertebral disc    and Cervical Neck   Kidney stone    Last 25 years ago     Social History   Socioeconomic History   Marital status: Married    Spouse name: Not on file   Number of children: 0   Years of education: Not on file   Highest education level: Not on file  Occupational History   Not on file  Tobacco Use   Smoking status: Never   Smokeless tobacco: Never  Vaping Use   Vaping Use: Never used  Substance and Sexual Activity   Alcohol use: No    Alcohol/week: 0.0 standard drinks of alcohol   Drug use: No   Sexual activity: Yes  Other Topics Concern   Not on file  Social History Narrative   Not on file   Social Determinants of Health   Financial Resource Strain: Not on file  Food Insecurity: Not on file  Transportation Needs: Not on file  Physical Activity: Not on file  Stress: Not on file  Social Connections: Not on file  Intimate Partner Violence: Not on file    Past Surgical History:  Procedure Laterality Date   BACK SURGERY     2012   2016 (oct  10)    CERVICAL FUSION     2009   COLONOSCOPY     INGUINAL HERNIA REPAIR     x4   INGUINAL HERNIA REPAIR Left 06/12/2015   Procedure: LAPAROSCOPIC LEFT INGUINAL HERNIA WITH MESH;  Surgeon: Axel Filler, MD;  Location: [facility] OR;  Service: General;  Laterality: Left;   INSERTION OF MESH Left 06/12/2015   Procedure: INSERTION OF MESH;  Surgeon: Axel Filler, MD;  Location: [facility] OR;  Service: General;  Laterality: Left;   LUMBAR LAMINECTOMY     2011   medial facetectomy     REATTACHMENT HAND     1981    Family History  Problem Relation Age of Onset   Arthritis Mother        Living   Prostate cancer Father 71       Deceased   Stroke Father    Bladder Cancer Brother    Alzheimer's disease Maternal Grandfather    Heart disease Other        Maternal Aunts & Uncles   Cancer Other        Paternal Aunts & Uncles   Heart disease  Sister        x1   Healthy Brother        x3   Psychiatric Illness Brother        x1    No Known Allergies  Current Outpatient Medications on File Prior to Visit  Medication Sig Dispense Refill   Multiple Vitamin (MULTIVITAMIN) tablet Take 1 tablet by mouth daily.     No current facility-administered medications on file prior to visit.    BP 135/80 Comment: 130/80 2nd reading.  Pulse 80   Temp 98 F (36.7 C)   Resp 18   Ht 5\' 11"  (1.803 m)   Wt 189 lb 12.8 oz (86.1 kg)   SpO2 98%   BMI 26.47 kg/m        Objective:   Physical Exam  General Mental Status- Alert. General Appearance- Not in acute distress.   Skin General: Color- Normal Color. Moisture- Normal Moisture.  Neck Carotid Arteries- Normal color. Moisture- Normal Moisture. No carotid bruits. No JVD.  Chest and Lung Exam Auscultation: Breath Sounds:-Normal.  Cardiovascular Auscultation:Rythm- Regular. Murmurs & Other Heart Sounds:Auscultation of the heart reveals- No Murmurs.  Abdomen Inspection:-Inspeection Normal. Palpation/Percussion:Note:No mass. Palpation and Percussion of the abdomen reveal- Non Tender, Non Distended + BS, no rebound or guarding.   Neurologic Cranial Nerve exam:- CN III-XII intact(No nystagmus), symmetric smile. Drift Test:- No drift. Romberg Exam:- Negative.  Heal to Toe Gait exam:-Normal. Finger to Nose:- Normal/Intact Strength:- 5/5 equal and symmetric strength both upper and lower extremities.   Neck- no mid c cpsine pain. Faint pain on palpation rt side of neck at the base.     Assessment & Plan:   Patient Instructions  For rt side neck pain will get c spine xray today. Can use tylenol for pain as discussed. No nsaids as will watch bp to make sure no htn.   Elevated bp- on 2nd check better bp readings. Eat low salt diet. Check bp days a week and do second consecutive readings as discussed if bp trend higher than 140/90 would recommend bp meds.  Low back pain  with hx of surgeries. Some sciatica type pain. If pain worsens can add gabapentin and get xray of lumbar spine.  Referral for skin cancer screening. If derm does not call you by 2 weeks let me know. Also want  to know when your appt is.  For high choleserol get lipid panel and cmp.  Flu vaccine and pneumonia vaccine today.  Follow up in 2 months or sooner if needed.    Esperanza Richters, PA-C

## 2022-05-09 NOTE — Patient Instructions (Addendum)
For rt side neck pain will get c spine xray today. Can use tylenol for pain as discussed. No nsaids as will watch bp to make sure no htn.   Elevated bp- on 2nd check better bp readings. Eat low salt diet. Check bp days a week and do second consecutive readings as discussed if bp trend higher than 140/90 would recommend bp meds.  Low back pain with hx of surgeries. Some sciatica type pain. If pain worsens can add gabapentin and get xray of lumbar spine.  Referral for skin cancer screening. If derm does not call you by 2 weeks let me know. Also want to know when your appt is.  For high cholesterol get lipid panel and cmp.  Flu vaccine and pneumonia vaccine today.  Follow up in 2 months or sooner if needed.

## 2022-05-22 ENCOUNTER — Encounter: Payer: Self-pay | Admitting: Medical

## 2022-05-24 NOTE — Telephone Encounter (Signed)
Please see below.

## 2022-05-28 DIAGNOSIS — L570 Actinic keratosis: Secondary | ICD-10-CM | POA: Diagnosis not present

## 2022-05-28 DIAGNOSIS — D1801 Hemangioma of skin and subcutaneous tissue: Secondary | ICD-10-CM | POA: Diagnosis not present

## 2022-05-28 DIAGNOSIS — D485 Neoplasm of uncertain behavior of skin: Secondary | ICD-10-CM | POA: Diagnosis not present

## 2022-05-28 DIAGNOSIS — L821 Other seborrheic keratosis: Secondary | ICD-10-CM | POA: Diagnosis not present

## 2022-05-28 DIAGNOSIS — C44519 Basal cell carcinoma of skin of other part of trunk: Secondary | ICD-10-CM | POA: Diagnosis not present

## 2022-06-12 DIAGNOSIS — C44519 Basal cell carcinoma of skin of other part of trunk: Secondary | ICD-10-CM | POA: Diagnosis not present

## 2023-05-29 ENCOUNTER — Telehealth: Payer: Self-pay | Admitting: Medical

## 2023-05-29 NOTE — Telephone Encounter (Signed)
Left detailed message on machine that he can get at pharmacy because he is medicare.  Also he needs an office visit cause he has not been seen in over a year.

## 2023-05-29 NOTE — Telephone Encounter (Signed)
Wife called and requested for the patient to be scheduled for a shingles shot. Please advise when pt can be scheduled.

## 2023-08-28 DIAGNOSIS — K080 Exfoliation of teeth due to systemic causes: Secondary | ICD-10-CM | POA: Diagnosis not present

## 2023-09-08 DIAGNOSIS — K080 Exfoliation of teeth due to systemic causes: Secondary | ICD-10-CM | POA: Diagnosis not present

## 2023-09-09 DIAGNOSIS — C44729 Squamous cell carcinoma of skin of left lower limb, including hip: Secondary | ICD-10-CM | POA: Diagnosis not present

## 2023-09-09 DIAGNOSIS — L821 Other seborrheic keratosis: Secondary | ICD-10-CM | POA: Diagnosis not present

## 2023-09-09 DIAGNOSIS — L570 Actinic keratosis: Secondary | ICD-10-CM | POA: Diagnosis not present

## 2023-09-09 DIAGNOSIS — Z129 Encounter for screening for malignant neoplasm, site unspecified: Secondary | ICD-10-CM | POA: Diagnosis not present

## 2023-09-09 DIAGNOSIS — D485 Neoplasm of uncertain behavior of skin: Secondary | ICD-10-CM | POA: Diagnosis not present

## 2023-09-09 DIAGNOSIS — Z85828 Personal history of other malignant neoplasm of skin: Secondary | ICD-10-CM | POA: Diagnosis not present

## 2023-09-12 DIAGNOSIS — K080 Exfoliation of teeth due to systemic causes: Secondary | ICD-10-CM | POA: Diagnosis not present

## 2023-09-16 DIAGNOSIS — K080 Exfoliation of teeth due to systemic causes: Secondary | ICD-10-CM | POA: Diagnosis not present

## 2023-09-22 DIAGNOSIS — K080 Exfoliation of teeth due to systemic causes: Secondary | ICD-10-CM | POA: Diagnosis not present

## 2023-09-29 DIAGNOSIS — C44722 Squamous cell carcinoma of skin of right lower limb, including hip: Secondary | ICD-10-CM | POA: Diagnosis not present

## 2023-09-29 DIAGNOSIS — C44729 Squamous cell carcinoma of skin of left lower limb, including hip: Secondary | ICD-10-CM | POA: Diagnosis not present

## 2023-10-08 DIAGNOSIS — K080 Exfoliation of teeth due to systemic causes: Secondary | ICD-10-CM | POA: Diagnosis not present

## 2023-10-14 DIAGNOSIS — K080 Exfoliation of teeth due to systemic causes: Secondary | ICD-10-CM | POA: Diagnosis not present

## 2023-10-22 DIAGNOSIS — K080 Exfoliation of teeth due to systemic causes: Secondary | ICD-10-CM | POA: Diagnosis not present

## 2023-12-08 DIAGNOSIS — L570 Actinic keratosis: Secondary | ICD-10-CM | POA: Diagnosis not present

## 2023-12-08 DIAGNOSIS — L820 Inflamed seborrheic keratosis: Secondary | ICD-10-CM | POA: Diagnosis not present

## 2023-12-18 NOTE — Progress Notes (Signed)
 [facility] Quality Team Note  Name: Gregory Foley Date of Birth: 25-Mar-1956 MRN: [id number] Date: 12/18/2023  [facility] Quality Team has reviewed this patient's chart, please see recommendations below:  [facility] Quality Other; (CHART REVIEWED FOR COLORECTAL CANCER SCREENING, LAST COLONOSCOPY OVER 10 YEARS AGO. PATIENT IS DUE.)

## 2023-12-25 ENCOUNTER — Encounter: Payer: Self-pay | Admitting: Medical

## 2024-04-05 DIAGNOSIS — Z85828 Personal history of other malignant neoplasm of skin: Secondary | ICD-10-CM | POA: Diagnosis not present

## 2024-04-05 DIAGNOSIS — C44511 Basal cell carcinoma of skin of breast: Secondary | ICD-10-CM | POA: Diagnosis not present

## 2024-04-05 DIAGNOSIS — W908XXS Exposure to other nonionizing radiation, sequela: Secondary | ICD-10-CM | POA: Diagnosis not present

## 2024-04-05 DIAGNOSIS — L570 Actinic keratosis: Secondary | ICD-10-CM | POA: Diagnosis not present

## 2024-04-05 DIAGNOSIS — C44519 Basal cell carcinoma of skin of other part of trunk: Secondary | ICD-10-CM | POA: Diagnosis not present

## 2024-04-05 DIAGNOSIS — L578 Other skin changes due to chronic exposure to nonionizing radiation: Secondary | ICD-10-CM | POA: Diagnosis not present

## 2024-04-05 DIAGNOSIS — L821 Other seborrheic keratosis: Secondary | ICD-10-CM | POA: Diagnosis not present
